# Patient Record
Sex: Female | Born: 1935 | Race: White | Hispanic: No | Marital: Single | Smoking: Former smoker
Health system: Southern US, Community
[De-identification: ages and names within clinical notes are randomized; demographics above are authoritative.]

## PROBLEM LIST (undated history)

## (undated) DIAGNOSIS — I10 Essential (primary) hypertension: Secondary | ICD-10-CM

## (undated) DIAGNOSIS — M1990 Unspecified osteoarthritis, unspecified site: Secondary | ICD-10-CM

## (undated) DIAGNOSIS — E785 Hyperlipidemia, unspecified: Secondary | ICD-10-CM

## (undated) DIAGNOSIS — I739 Peripheral vascular disease, unspecified: Secondary | ICD-10-CM

## (undated) DIAGNOSIS — M8580 Other specified disorders of bone density and structure, unspecified site: Secondary | ICD-10-CM

## (undated) DIAGNOSIS — K219 Gastro-esophageal reflux disease without esophagitis: Secondary | ICD-10-CM

## (undated) HISTORY — PX: ABDOMINAL HYSTERECTOMY: SHX81

## (undated) HISTORY — DX: Other specified disorders of bone density and structure, unspecified site: M85.80

## (undated) HISTORY — DX: Peripheral vascular disease, unspecified: I73.9

## (undated) HISTORY — DX: Gastro-esophageal reflux disease without esophagitis: K21.9

## (undated) HISTORY — DX: Hyperlipidemia, unspecified: E78.5

## (undated) HISTORY — DX: Unspecified osteoarthritis, unspecified site: M19.90

## (undated) HISTORY — DX: Essential (primary) hypertension: I10

---

## 1998-04-20 ENCOUNTER — Other Ambulatory Visit: Admission: RE | Admit: 1998-04-20 | Discharge: 1998-04-20 | Payer: Self-pay | Admitting: Gastroenterology

## 1998-07-19 ENCOUNTER — Emergency Department (HOSPITAL_COMMUNITY): Admission: EM | Admit: 1998-07-19 | Discharge: 1998-07-19 | Payer: Self-pay | Admitting: Emergency Medicine

## 2004-04-12 ENCOUNTER — Ambulatory Visit: Payer: Self-pay | Admitting: Internal Medicine

## 2004-06-13 ENCOUNTER — Ambulatory Visit: Payer: Self-pay | Admitting: Internal Medicine

## 2004-09-27 ENCOUNTER — Ambulatory Visit: Payer: Self-pay | Admitting: Internal Medicine

## 2004-10-28 ENCOUNTER — Ambulatory Visit: Payer: Self-pay | Admitting: Internal Medicine

## 2004-11-11 ENCOUNTER — Ambulatory Visit: Payer: Self-pay | Admitting: Cardiology

## 2006-02-12 ENCOUNTER — Ambulatory Visit: Payer: Self-pay | Admitting: Internal Medicine

## 2006-02-17 ENCOUNTER — Ambulatory Visit: Payer: Self-pay | Admitting: Internal Medicine

## 2006-03-04 ENCOUNTER — Ambulatory Visit: Payer: Self-pay

## 2006-08-06 ENCOUNTER — Ambulatory Visit: Payer: Self-pay | Admitting: Internal Medicine

## 2007-02-23 IMAGING — CT CT PELVIS W/ CM
1 of 4 series · 14 of 32 positions shown, 19 images · IV contrast (omnipaque)
Comparison: none

CLINICAL DATA: Epigastric pain going back several months.  Hypertension. 
 ABDOMEN CT WITH CONTRAST:
TECHNIQUE: Multidetector CT imaging of the abdomen was performed following the standard protocol during bolus administration of intravenous contrast.
 Contrast:  80 cc Omnipaque 300
 The lung bases are clear.  There is an enhancing focus in the dome of the right lobe of liver most consistent with a small hemangioma.  The remainder of the liver is rather low in attenuation consistent with diffuse fatty infiltrate.  No ductal dilatation is seen.  The gallbladder is contracted and no calcified gallstones are seen.  The pancreas is normal in size with normal peripancreatic fat planes.  The adrenal glands and spleen appear normal.  The kidneys enhance normally and on delayed images the pelvocaliceal systems appear normal.  The abdominal aorta is normal in caliber.
TECHNIQUE: Multidetector CT imaging of the pelvis was performed following the standard protocol during bolus administration of intravenous contrast.
 Scans were continued through the pelvis after oral and IV contrast media were given.  The colon is relatively decompressed.  The appendix is well seen and appears normal.  The urinary bladder is unremarkable and the uterus appears to have been removed.  No pelvic mass or fluid is seen.  No bony abnormality is noted.

[Series 2: abd_pel 5.0 b30f st · axial · 0.65mm/px · z∈[-472,-122]mm · 14 of 80 slices shown, 19 images]
[im 5/80  soft-tissue]
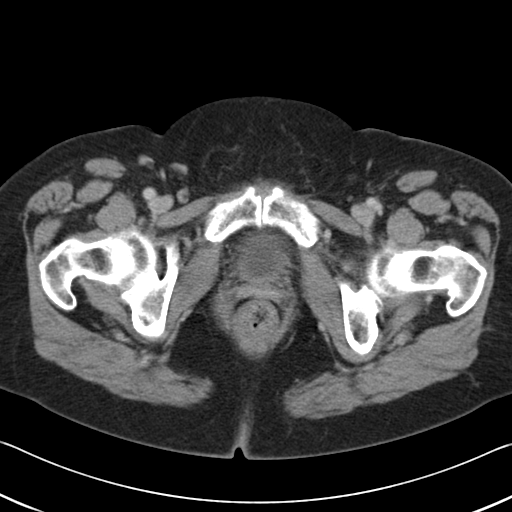
[im 5/80  bone]
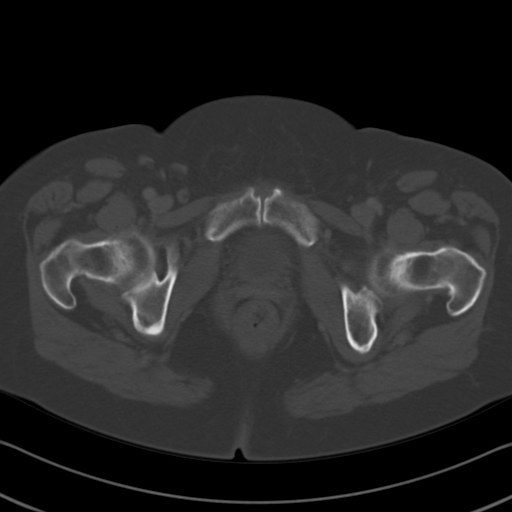
[im 10/80  soft-tissue]
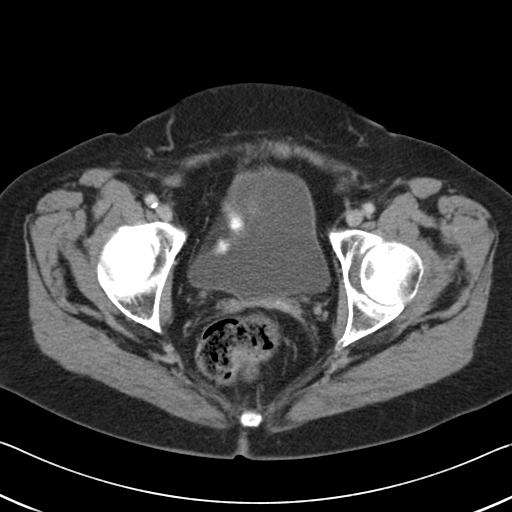
[im 19/80  soft-tissue]
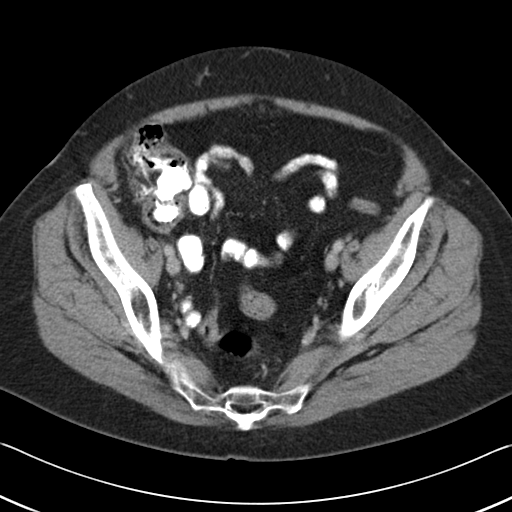
[im 24/80  soft-tissue]
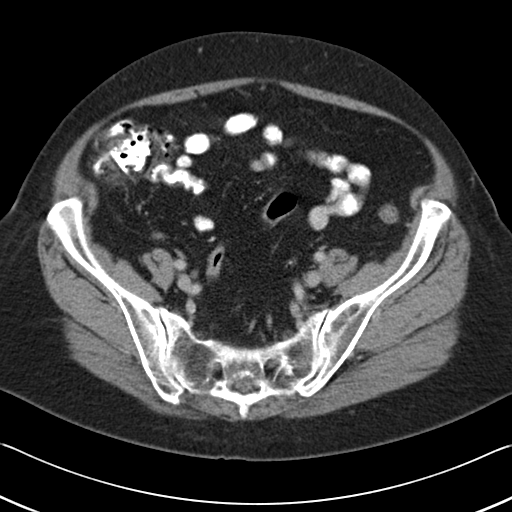
[im 28/80  soft-tissue]
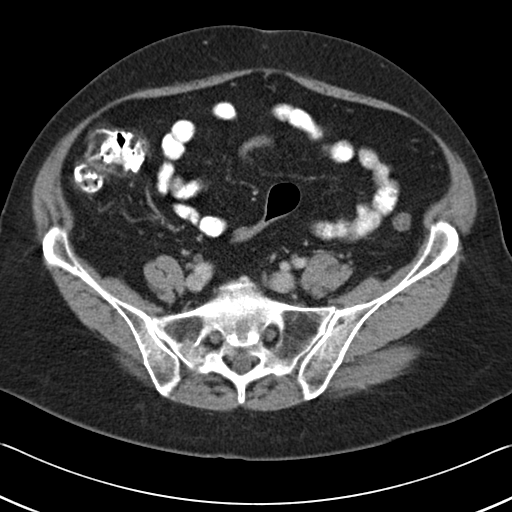
[im 33/80  soft-tissue]
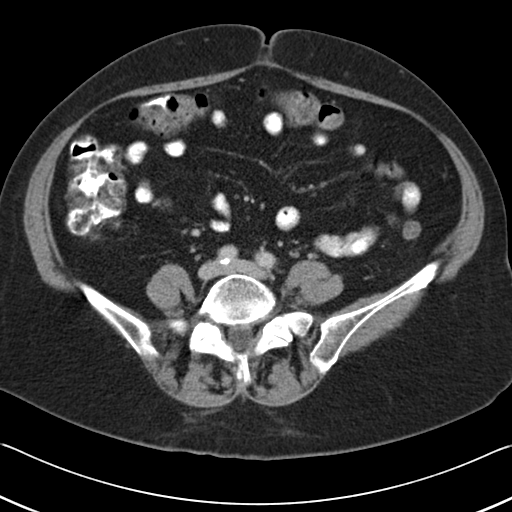
[im 42/80  soft-tissue]
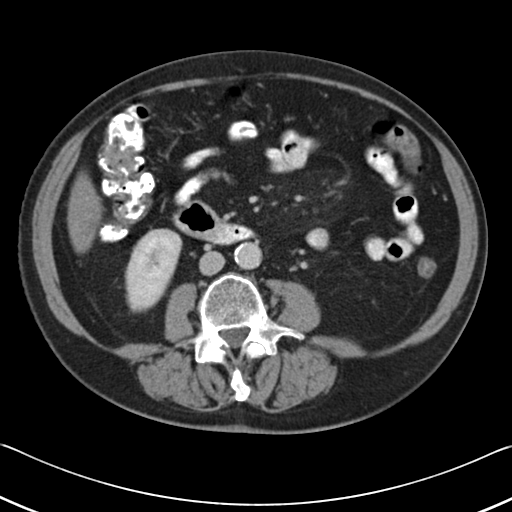
[im 47/80  soft-tissue]
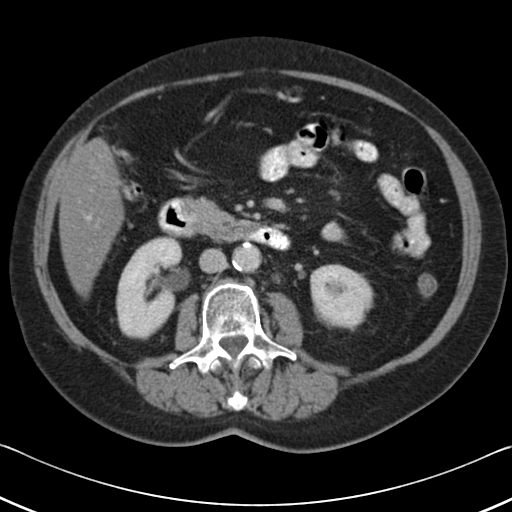
[im 52/80  soft-tissue]
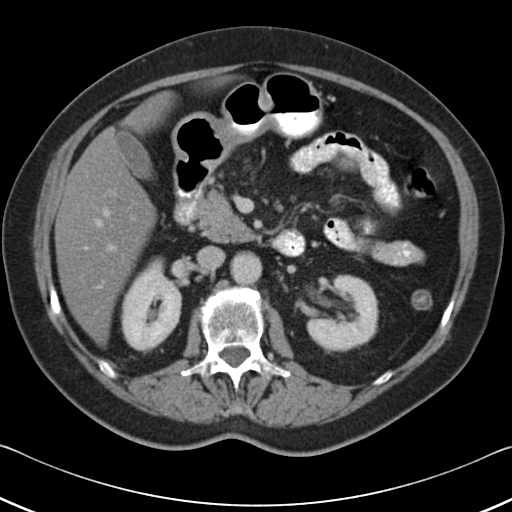
[im 52/80  bone]
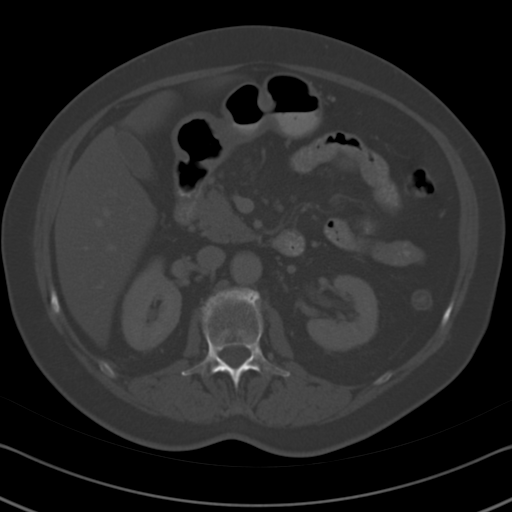
[im 56/80  soft-tissue]
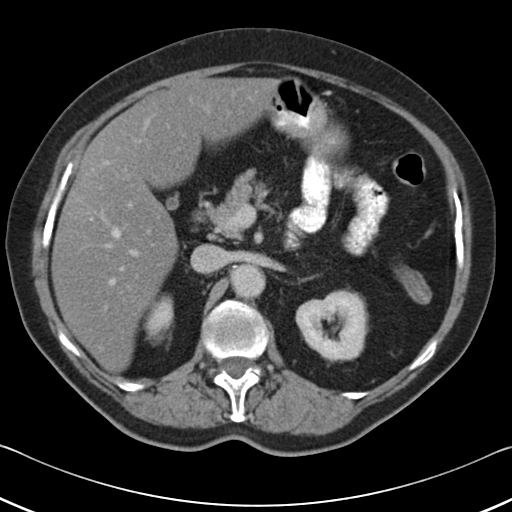
[im 61/80  soft-tissue]
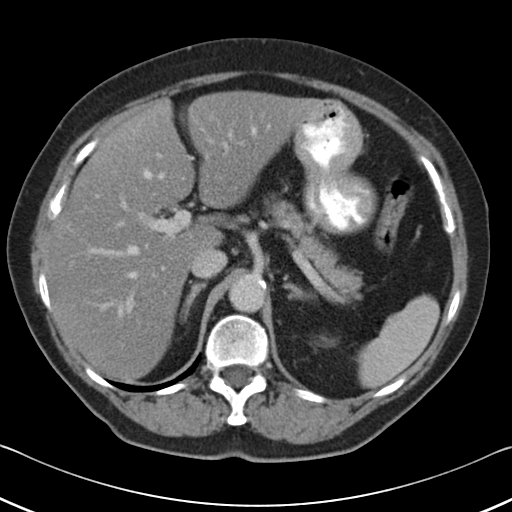
[im 61/80  lung]
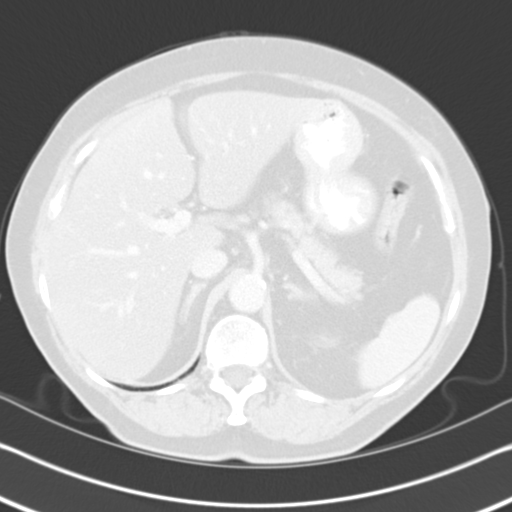
[im 66/80  lung]
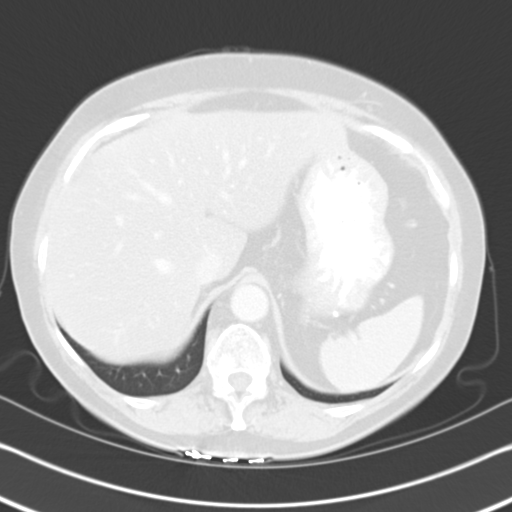
[im 70/80  soft-tissue]
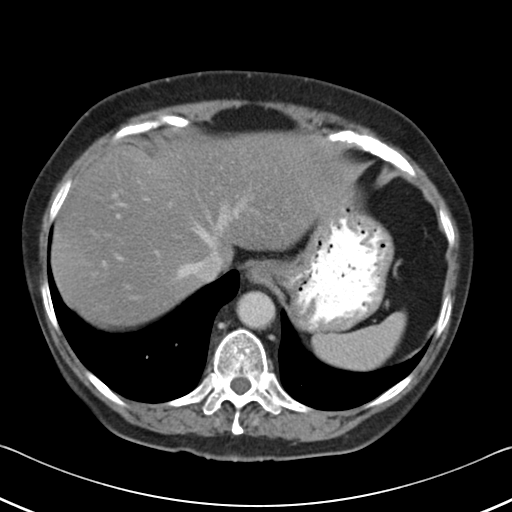
[im 70/80  lung]
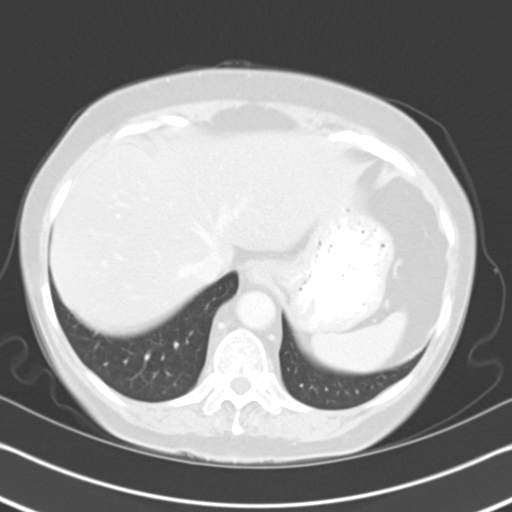
[im 75/80  soft-tissue]
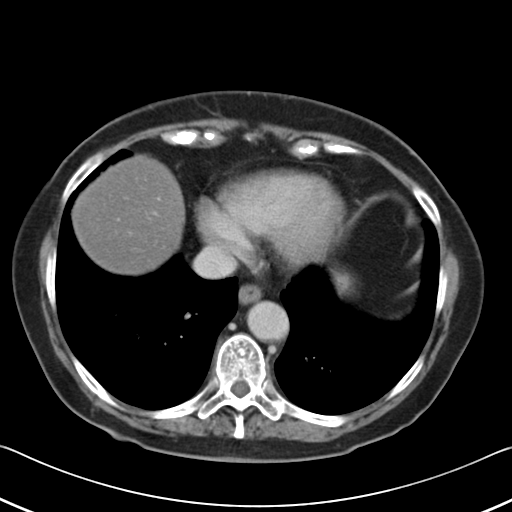
[im 75/80  lung]
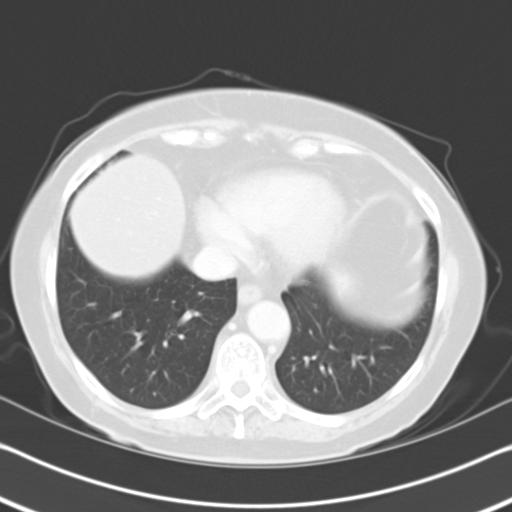

[14 of 32 positions shown; findings below may reference images not displayed]

IMPRESSION: 1.  Probable diffuse fatty infiltration of the liver with small area of enhancement in the dome of the right lobe most consistent with small hemangioma.  No ductal dilatation. 
 PELVIS CT WITH CONTRAST:
IMPRESSION: Negative CT of the pelvis for acute abnormality.  The appendix appears normal.

## 2007-03-09 ENCOUNTER — Telehealth: Payer: Self-pay | Admitting: Internal Medicine

## 2007-03-15 ENCOUNTER — Ambulatory Visit: Payer: Self-pay | Admitting: Internal Medicine

## 2007-03-15 LAB — CONVERTED CEMR LAB
Alkaline Phosphatase: 44 units/L (ref 39–117)
BUN: 16 mg/dL (ref 6–23)
Bilirubin Urine: NEGATIVE
Bilirubin, Direct: 0.1 mg/dL (ref 0.0–0.3)
Direct LDL: 186.2 mg/dL
Eosinophils Relative: 1.7 % (ref 0.0–5.0)
GFR calc Af Amer: 78 mL/min
HCT: 42.3 % (ref 36.0–46.0)
HDL: 58.7 mg/dL (ref >39.0)
Hemoglobin: 14.8 g/dL (ref 12.0–15.0)
Ketones, ur: NEGATIVE mg/dL
Lymphocytes Relative: 31.3 % (ref 12.0–46.0)
MCHC: 35 g/dL (ref 30.0–36.0)
MCV: 96.8 fL (ref 78.0–100.0)
Monocytes Relative: 8.9 % (ref 3.0–11.0)
Neutrophils Relative %: 57.5 % (ref 43.0–77.0)
Nitrite: NEGATIVE
Platelets: 190 10*3/uL (ref 150–400)
RDW: 11.8 % (ref 11.5–14.6)
Sodium: 143 meq/L (ref 135–145)
Specific Gravity, Urine: 1.02 (ref 1.000–1.03)
Total Bilirubin: 0.7 mg/dL (ref 0.3–1.2)
Total CHOL/HDL Ratio: 4.6
Urine Glucose: NEGATIVE mg/dL
VLDL: 31 mg/dL (ref 0–40)
WBC: 6.4 10*3/uL (ref 4.5–10.5)

## 2007-03-22 ENCOUNTER — Ambulatory Visit: Payer: Self-pay | Admitting: Internal Medicine

## 2007-03-22 DIAGNOSIS — K219 Gastro-esophageal reflux disease without esophagitis: Secondary | ICD-10-CM

## 2007-03-22 DIAGNOSIS — M949 Disorder of cartilage, unspecified: Secondary | ICD-10-CM

## 2007-03-22 DIAGNOSIS — I10 Essential (primary) hypertension: Secondary | ICD-10-CM | POA: Insufficient documentation

## 2007-03-22 DIAGNOSIS — M899 Disorder of bone, unspecified: Secondary | ICD-10-CM | POA: Insufficient documentation

## 2007-03-22 DIAGNOSIS — E785 Hyperlipidemia, unspecified: Secondary | ICD-10-CM

## 2007-03-22 DIAGNOSIS — R609 Edema, unspecified: Secondary | ICD-10-CM

## 2007-03-26 ENCOUNTER — Encounter: Payer: Self-pay | Admitting: Internal Medicine

## 2007-03-26 DIAGNOSIS — G4700 Insomnia, unspecified: Secondary | ICD-10-CM | POA: Insufficient documentation

## 2007-04-22 ENCOUNTER — Telehealth: Payer: Self-pay | Admitting: Internal Medicine

## 2007-05-21 ENCOUNTER — Ambulatory Visit: Payer: Self-pay | Admitting: Internal Medicine

## 2007-05-21 DIAGNOSIS — N3090 Cystitis, unspecified without hematuria: Secondary | ICD-10-CM | POA: Insufficient documentation

## 2007-05-21 DIAGNOSIS — R5381 Other malaise: Secondary | ICD-10-CM

## 2007-05-21 DIAGNOSIS — R5383 Other fatigue: Secondary | ICD-10-CM

## 2007-05-21 DIAGNOSIS — R209 Unspecified disturbances of skin sensation: Secondary | ICD-10-CM

## 2007-05-21 DIAGNOSIS — L723 Sebaceous cyst: Secondary | ICD-10-CM

## 2007-05-21 LAB — CONVERTED CEMR LAB
Albumin: 4.2 g/dL (ref 3.5–5.2)
Basophils Absolute: 0.2 10*3/uL — ABNORMAL HIGH (ref 0.0–0.1)
Basophils Relative: 2.1 % — ABNORMAL HIGH (ref 0.0–1.0)
Bilirubin Urine: NEGATIVE
Bilirubin, Direct: 0.1 mg/dL (ref 0.0–0.3)
CO2: 28 meq/L (ref 19–32)
Eosinophils Relative: 2.1 % (ref 0.0–5.0)
GFR calc Af Amer: 89 mL/min
GFR calc non Af Amer: 74 mL/min
Glucose, Bld: 103 mg/dL — ABNORMAL HIGH (ref 70–99)
Hemoglobin, Urine: NEGATIVE
Hemoglobin: 13.5 g/dL (ref 12.0–15.0)
MCHC: 35.5 g/dL (ref 30.0–36.0)
Monocytes Absolute: 0.6 10*3/uL (ref 0.2–0.7)
Mucus, UA: NEGATIVE
Neutro Abs: 5.1 10*3/uL (ref 1.4–7.7)
Neutrophils Relative %: 58 % (ref 43.0–77.0)
Nitrite: NEGATIVE
Potassium: 4.2 meq/L (ref 3.5–5.1)
RBC: 3.94 M/uL (ref 3.87–5.11)
Sodium: 137 meq/L (ref 135–145)
Specific Gravity, Urine: 1.015 (ref 1.000–1.03)
TSH: 0.77 microintl units/mL (ref 0.35–5.50)
Total Bilirubin: 0.7 mg/dL (ref 0.3–1.2)
Total Protein: 6.8 g/dL (ref 6.0–8.3)
Urine Glucose: NEGATIVE mg/dL
Urobilinogen, UA: 0.2 (ref 0.0–1.0)
Vitamin B-12: 559 pg/mL (ref 211–911)

## 2007-05-25 ENCOUNTER — Telehealth: Payer: Self-pay | Admitting: Internal Medicine

## 2007-06-25 ENCOUNTER — Ambulatory Visit: Payer: Self-pay | Admitting: Internal Medicine

## 2007-06-25 DIAGNOSIS — I739 Peripheral vascular disease, unspecified: Secondary | ICD-10-CM

## 2007-09-13 ENCOUNTER — Encounter: Payer: Self-pay | Admitting: Internal Medicine

## 2007-09-13 ENCOUNTER — Ambulatory Visit: Payer: Self-pay | Admitting: Internal Medicine

## 2007-09-16 ENCOUNTER — Encounter: Payer: Self-pay | Admitting: Internal Medicine

## 2007-09-29 ENCOUNTER — Encounter: Payer: Self-pay | Admitting: Internal Medicine

## 2007-09-30 ENCOUNTER — Ambulatory Visit: Payer: Self-pay | Admitting: Internal Medicine

## 2007-12-15 ENCOUNTER — Ambulatory Visit: Payer: Self-pay | Admitting: Internal Medicine

## 2007-12-16 LAB — CONVERTED CEMR LAB
Alkaline Phosphatase: 43 units/L (ref 39–117)
BUN: 18 mg/dL (ref 6–23)
Bilirubin Urine: NEGATIVE
Bilirubin, Direct: 0.1 mg/dL (ref 0.0–0.3)
Cholesterol: 240 mg/dL (ref 0–200)
GFR calc non Af Amer: 74 mL/min
Glucose, Bld: 111 mg/dL — ABNORMAL HIGH (ref 70–99)
Ketones, ur: NEGATIVE mg/dL
Nitrite: NEGATIVE
Potassium: 5.1 meq/L (ref 3.5–5.1)
Sodium: 143 meq/L (ref 135–145)
Specific Gravity, Urine: 1.025 (ref 1.000–1.03)
TSH: 1.48 microintl units/mL (ref 0.35–5.50)
Triglycerides: 185 mg/dL — ABNORMAL HIGH (ref 0–149)
Urobilinogen, UA: 0.2 (ref 0.0–1.0)
pH: 5.5 (ref 5.0–8.0)

## 2007-12-22 ENCOUNTER — Ambulatory Visit: Payer: Self-pay | Admitting: Internal Medicine

## 2007-12-22 DIAGNOSIS — R350 Frequency of micturition: Secondary | ICD-10-CM

## 2007-12-22 DIAGNOSIS — H60399 Other infective otitis externa, unspecified ear: Secondary | ICD-10-CM | POA: Insufficient documentation

## 2007-12-22 DIAGNOSIS — R109 Unspecified abdominal pain: Secondary | ICD-10-CM | POA: Insufficient documentation

## 2008-04-21 ENCOUNTER — Ambulatory Visit: Payer: Self-pay | Admitting: Internal Medicine

## 2008-04-23 LAB — CONVERTED CEMR LAB
Calcium: 9.6 mg/dL (ref 8.4–10.5)
Chloride: 104 meq/L (ref 96–112)
Creatinine, Ser: 0.8 mg/dL (ref 0.4–1.2)
GFR calc Af Amer: 89 mL/min
GFR calc non Af Amer: 74 mL/min

## 2008-04-27 ENCOUNTER — Ambulatory Visit: Payer: Self-pay | Admitting: Internal Medicine

## 2008-04-27 DIAGNOSIS — R7309 Other abnormal glucose: Secondary | ICD-10-CM | POA: Insufficient documentation

## 2008-10-25 ENCOUNTER — Ambulatory Visit: Payer: Self-pay | Admitting: Internal Medicine

## 2008-10-25 LAB — CONVERTED CEMR LAB
ALT: 15 units/L (ref 0–35)
AST: 18 units/L (ref 0–37)
Alkaline Phosphatase: 46 units/L (ref 39–117)
BUN: 12 mg/dL (ref 6–23)
Bilirubin, Direct: 0.1 mg/dL (ref 0.0–0.3)
CO2: 30 meq/L (ref 19–32)
Calcium: 9.3 mg/dL (ref 8.4–10.5)
Chloride: 103 meq/L (ref 96–112)
Direct LDL: 177.6 mg/dL
Hemoglobin, Urine: NEGATIVE
Hgb A1c MFr Bld: 5.8 % (ref 4.6–6.5)
Ketones, ur: NEGATIVE mg/dL
Nitrite: NEGATIVE
Potassium: 4.9 meq/L (ref 3.5–5.1)
Sodium: 143 meq/L (ref 135–145)
Specific Gravity, Urine: 1.02 (ref 1.000–1.030)
TSH: 1.9 microintl units/mL (ref 0.35–5.50)
Total Bilirubin: 0.8 mg/dL (ref 0.3–1.2)
Triglycerides: 147 mg/dL (ref 0.0–149.0)
Urobilinogen, UA: 0.2 (ref 0.0–1.0)

## 2008-10-30 ENCOUNTER — Ambulatory Visit: Payer: Self-pay | Admitting: Internal Medicine

## 2009-01-19 ENCOUNTER — Encounter: Payer: Self-pay | Admitting: Internal Medicine

## 2009-04-23 ENCOUNTER — Ambulatory Visit: Payer: Self-pay | Admitting: Internal Medicine

## 2009-04-23 DIAGNOSIS — M1990 Unspecified osteoarthritis, unspecified site: Secondary | ICD-10-CM | POA: Insufficient documentation

## 2009-04-23 DIAGNOSIS — M545 Low back pain: Secondary | ICD-10-CM | POA: Insufficient documentation

## 2009-04-24 LAB — CONVERTED CEMR LAB
Nitrite: NEGATIVE
Urobilinogen, UA: 0.2 (ref 0.0–1.0)

## 2009-08-01 ENCOUNTER — Ambulatory Visit: Payer: Self-pay | Admitting: Internal Medicine

## 2010-01-25 ENCOUNTER — Ambulatory Visit: Payer: Self-pay | Admitting: Internal Medicine

## 2010-01-25 LAB — CONVERTED CEMR LAB
AST: 18 units/L (ref 0–37)
BUN: 17 mg/dL (ref 6–23)
Basophils Absolute: 0 10*3/uL (ref 0.0–0.1)
Basophils Relative: 0.8 % (ref 0.0–3.0)
Bilirubin Urine: NEGATIVE
Bilirubin, Direct: 0.2 mg/dL (ref 0.0–0.3)
CO2: 29 meq/L (ref 19–32)
Calcium: 9.2 mg/dL (ref 8.4–10.5)
Chloride: 104 meq/L (ref 96–112)
Direct LDL: 152.9 mg/dL
Eosinophils Absolute: 0.1 10*3/uL (ref 0.0–0.7)
GFR calc non Af Amer: 68.39 mL/min (ref >60)
Glucose, Bld: 115 mg/dL — ABNORMAL HIGH (ref 70–99)
HDL: 60.9 mg/dL (ref >39.00)
Hemoglobin: 14.4 g/dL (ref 12.0–15.0)
Ketones, ur: NEGATIVE mg/dL
Lymphocytes Relative: 28.8 % (ref 12.0–46.0)
MCHC: 34.9 g/dL (ref 30.0–36.0)
Monocytes Relative: 7.7 % (ref 3.0–12.0)
Neutrophils Relative %: 60.8 % (ref 43.0–77.0)
RBC: 4.21 M/uL (ref 3.87–5.11)
RDW: 13 % (ref 11.5–14.6)
Sodium: 140 meq/L (ref 135–145)
Total Bilirubin: 0.7 mg/dL (ref 0.3–1.2)
Total Protein: 6.5 g/dL (ref 6.0–8.3)
Triglycerides: 132 mg/dL (ref 0.0–149.0)
WBC: 6 10*3/uL (ref 4.5–10.5)

## 2010-01-31 ENCOUNTER — Ambulatory Visit: Payer: Self-pay | Admitting: Internal Medicine

## 2010-02-15 ENCOUNTER — Telehealth: Payer: Self-pay | Admitting: Internal Medicine

## 2010-06-11 NOTE — Miscellaneous (Signed)
Summary: [facility]   Imported By: Lester Emporia 08/09/2009 11:46:30  _____________________________________________________________________  External Attachment:    Type:   Image     Comment:   External Document

## 2010-06-11 NOTE — Assessment & Plan Note (Signed)
Summary: CPX / [employer] /[employer]  #   Vital Signs:  Patient profile:   77 year old female Height:      61 inches Weight:      143 pounds BMI:     27.12 Temp:     97.1 degrees F oral Pulse rate:   80 / minute Pulse rhythm:   regular Resp:     16 per minute BP sitting:   126 / 70  (left arm) Cuff size:   regular  Vitals Entered By: Lanier Prude, Beverly Gust) (January 31, 2010 8:34 AM) CC: CPX Is Patient Diabetic? No   CC:  CPX.  History of Present Illness: The patient presents for a preventive health examination  Patient past medical history, social history, and family history reviewed in detail no significant changes.  Patient is physically active. Depression is negative and mood is good. Hearing is normal, and able to perform activities of daily living. Risk of falling is negligible and home safety has been reviewed and is appropriate. Patient has normal height, weight, and visual acuity. Patient has been counseled on age-appropriate routine health concerns for screening and prevention. Education, counseling done.   Preventive Screening-Counseling & Management  Alcohol-Tobacco     Alcohol drinks/day: 3     Alcohol type: wine     Alcohol Counseling: to decrease amount and/or frequency of alcohol intake     Smoking Status: quit > 6 months  Caffeine-Diet-Exercise     Caffeine Counseling: not indicated; caffeine use is not excessive or problematic     Diet Counseling: not indicated; diet is assessed to be healthy     Does Patient Exercise: no     Depression Counseling: not indicated; screening negative for depression  Hep-HIV-STD-Contraception     Hepatitis Risk: no risk noted     Sun Exposure-Excessive: no  Safety-Violence-Falls     Seat Belt Use: yes      Sexual History:  currently monogamous.    Current Medications (verified): 1)  Enalapril Maleate 10 Mg  Tabs (Enalapril Maleate) .Marland Kitchen.. 1 Bid 2)  Furosemide 20 Mg Tabs (Furosemide) .Marland Kitchen.. 1 Qam As Needed Swelling 3)  Potassium  Chloride Cr 10 Meq  Tbcr (Potassium Chloride) .Marland Kitchen.. 1 Once Daily As Needed With Furosemide 4)  Polyethylene Glycol 3350  Powd (Polyethylene Glycol 3350) .... Mix 1 Capful in Water or Juice and Drink Once Daily As Directed. 5)  Aspir-Low 81 Mg Tbec (Aspirin) .Marland Kitchen.. 1 Once Daily After Meal 6)  Ranitidine Hcl 150 Mg Caps (Ranitidine Hcl) .Marland Kitchen.. 1 Po Bid 7)  Vitamin D3 1000 Unit  Tabs (Cholecalciferol) .Marland Kitchen.. 1 Qd 8)  Premarin 0.625 Mg/gm Crea (Estrogens, Conjugated) .Marland Kitchen.. 1 G Pv As Dirrected Q3d X 12 D, Then 1g Pv Q 7-14 D For Dryness  Allergies (verified): 1)  Lipitor 2)  Vytorin 3)  Fosamax 4)  Tegretol 5)  Fish Oil (Fish Oil)  Past History:  Past Medical History: Last updated: 04/23/2009 GERD Hyperlipidemia  ( Statin intolerant) Osteopenia Hemorroids Hypertension Constipation Peripheral vascular disease carotids Low back pain Osteoarthritis  Past Surgical History: Last updated: 03/22/2007 Hysterectomy complete  Family History: Last updated: 03/22/2007 Family History of CAD Female 1st degree relative <60  Social History: Occupation: office work full time Former Smoker Married Regular exercise-no Alcohol use-yes 2-3 glasses of wine Does Patient Exercise:  no Smoking Status:  quit > 6 months Hepatitis Risk:  no risk noted Sun Exposure-Excessive:  no Seat Belt Use:  yes Sexual History:  currently monogamous  Contraindications/Deferment  of Procedures/Staging:    Test/Procedure: Colonoscopy    Reason for deferment: patient declined   Review of Systems  The patient denies anorexia, fever, weight loss, weight gain, vision loss, decreased hearing, hoarseness, chest pain, syncope, dyspnea on exertion, peripheral edema, prolonged cough, headaches, hemoptysis, abdominal pain, melena, hematochezia, severe indigestion/heartburn, hematuria, incontinence, genital sores, muscle weakness, suspicious skin lesions, transient blindness, difficulty walking, depression, unusual weight  change, abnormal bleeding, enlarged lymph nodes, angioedema, and breast masses.    Physical Exam  General:  Well-developed,well-nourished,in no acute distress; alert,appropriate and cooperative throughout examination Head:  Normocephalic and atraumatic without obvious abnormalities. No apparent alopecia or balding. Eyes:  No corneal or conjunctival inflammation noted. EOMI. Perrla. Funduscopic exam benign, without hemorrhages, exudates or papilledema. Vision grossly normal. Ears:  External ear exam shows no significant lesions or deformities.  Otoscopic examination reveals clear canals, tympanic membranes are intact bilaterally without bulging, retraction, inflammation or discharge. Hearing is grossly normal bilaterally. Nose:  External nasal examination shows no deformity or inflammation. Nasal mucosa are pink and moist without lesions or exudates. Mouth:  Oral mucosa and oropharynx without lesions or exudates.  Teeth in good repair. Neck:  No deformities, masses, or tenderness noted. Chest Wall:  No deformities, masses, or tenderness noted. Lungs:  Normal respiratory effort, chest expands symmetrically. Lungs are clear to auscultation, no crackles or wheezes. Heart:  Normal rate and regular rhythm. S1 and S2 normal without gallop, murmur, click, rub or other extra sounds. Abdomen:  Bowel sounds positive,abdomen soft and non-tender without masses, organomegaly or hernias noted. Msk:  Lumbar-sacral spine is tender to palpation over paraspinal muscles and painfull with the ROM, very stiff Pulses:  R and L carotid,radial,femoral,dorsalis pedis and posterior tibial pulses are full and equal bilaterally Extremities:  No clubbing, cyanosis, edema, or deformity noted with normal full range of motion of all joints.   Neurologic:  No cranial nerve deficits noted. Station and gait are normal. Plantar reflexes are down-going bilaterally. DTRs are symmetrical throughout. Sensory, motor and coordinative  functions appear intact. Skin:  Intact without suspicious lesions or rashes Cervical Nodes:  No lymphadenopathy noted Psych:  Cognition and judgment appear intact. Alert and cooperative with normal attention span and concentration. No apparent delusions, illusions, hallucinations   Impression & Recommendations:  Problem # 1:  HEALTH MAINTENANCE EXAM (ICD-V70.0) Assessment New Declined Colonoscopy Overall doing well, age appropriate education and counseling updated and referral for appropriate preventive services done unless declined, immunizations up to date or declined, diet counseling done if overweight, urged to quit smoking if smokes, most recent labs reviewed and current ordered if appropriate, ecg reviewed or declined (interpretation per ECG scanned in the EMR if done); information regarding Medicare Preventation requirements given if appropriate.  The labs were reviewed with the patient.  GYN/mammo q 12 months   Problem # 2:  HYPERTENSION (ICD-401.9) Assessment: Unchanged  Her updated medication list for this problem includes:    Enalapril Maleate 10 Mg Tabs (Enalapril maleate) .Marland Kitchen... 1 bid    Furosemide 20 Mg Tabs (Furosemide) .Marland Kitchen... 1 qam as needed swelling  BP today: 126/70 Prior BP: 112/70 (08/01/2009)  Labs Reviewed: K+: 5.0 (01/25/2010) Creat: : 0.9 (01/25/2010)   Chol: 229 (01/25/2010)   HDL: 60.90 (01/25/2010)   LDL: DEL (12/15/2007)   TG: 132.0 (01/25/2010)  Problem # 3:  PERIPHERAL VASCULAR DISEASE (ICD-443.9) Assessment: Unchanged On the regimen of medicine(s) reflected in the chart    Problem # 4:  GERD (ICD-530.81) Assessment: Improved  Her updated medication  list for this problem includes:    Ranitidine Hcl 150 Mg Caps (Ranitidine hcl) .Marland Kitchen... 1 po bid  Problem # 5:  HYPERLIPIDEMIA (ICD-272.4) Assessment: Comment Only on diet - statin intol.  Complete Medication List: 1)  Enalapril Maleate 10 Mg Tabs (Enalapril maleate) .Marland Kitchen.. 1 bid 2)  Furosemide 20 Mg  Tabs (Furosemide) .Marland Kitchen.. 1 qam as needed swelling 3)  Potassium Chloride Cr 10 Meq Tbcr (Potassium chloride) .Marland Kitchen.. 1 once daily as needed with furosemide 4)  Polyethylene Glycol 3350 Powd (Polyethylene glycol 3350) .... Mix 1 capful in water or juice and drink once daily as directed. 5)  Aspir-low 81 Mg Tbec (Aspirin) .Marland Kitchen.. 1 once daily after meal 6)  Ranitidine Hcl 150 Mg Caps (Ranitidine hcl) .Marland Kitchen.. 1 po bid 7)  Vitamin D3 1000 Unit Tabs (Cholecalciferol) .Marland Kitchen.. 1 qd 8)  Premarin 0.625 Mg/gm Crea (Estrogens, conjugated) .Marland Kitchen.. 1 g pv as dirrected q3d x 12 d, then 1g pv q 7-14 d for dryness  Other Orders: Admin 1st Vaccine (01027) Flu Vaccine 42yrs + (25366)  Patient Instructions: 1)  Start taking a chair yoga class 2)  Please schedule a follow-up appointment in 6 months. Prescriptions: RANITIDINE HCL 150 MG CAPS (RANITIDINE HCL) 1 po bid  #180 Each x 3   Entered and Authorized by:   Tresa Garter MD   Signed by:   Tresa Garter MD on 01/31/2010   Method used:   Print then Give to Patient   RxID:   [id number] POLYETHYLENE GLYCOL 3350  POWD (POLYETHYLENE GLYCOL 3350) mix 1 capful in water or juice and drink once daily as directed.  #527 Gram x 3   Entered and Authorized by:   Tresa Garter MD   Signed by:   Tresa Garter MD on 01/31/2010   Method used:   Print then Give to Patient   RxID:   [id number] POTASSIUM CHLORIDE CR 10 MEQ  TBCR (POTASSIUM CHLORIDE) 1 once daily as needed with furosemide  #90 x 3   Entered and Authorized by:   Tresa Garter MD   Signed by:   Tresa Garter MD on 01/31/2010   Method used:   Print then Give to Patient   RxID:   [id number] FUROSEMIDE 20 MG TABS (FUROSEMIDE) 1 qam as needed swelling  #90 x 3   Entered and Authorized by:   Tresa Garter MD   Signed by:   Tresa Garter MD on 01/31/2010   Method used:   Print then Give to Patient   RxID:   [id number] ENALAPRIL MALEATE 10 MG  TABS  (ENALAPRIL MALEATE) 1 BID  #180 x 3   Entered and Authorized by:   Tresa Garter MD   Signed by:   Tresa Garter MD on 01/31/2010   Method used:   Print then Give to Patient   RxID:   [id number]   .lbflu   Flu Vaccine Consent Questions     Do you have a history of severe allergic reactions to this vaccine? no    Any prior history of allergic reactions to egg and/or gelatin? no    Do you have a sensitivity to the preservative Thimersol? no    Do you have a past history of Guillan-Barre Syndrome? no    Do you currently have an acute febrile illness? no    Have you ever had a severe reaction to latex? no    Vaccine information given and explained to patient? yes  Are you currently pregnant? no    Lot Number:[id number]   Exp Date:11/09/2010   Site Given  right Deltoid IM Lanier Prude, [facility])  January 31, 2010 9:50 AM

## 2010-06-11 NOTE — Assessment & Plan Note (Signed)
Summary: 3 MTH FU   STC   Vital Signs:  Patient profile:   77 year old female Weight:      146 pounds BMI:     27.69 Temp:     98 degrees F oral Pulse rate:   72 / minute BP sitting:   112 / 70  (left arm)  Vitals Entered By: Tora Perches (August 01, 2009 8:11 AM) CC: f/u Is Patient Diabetic? No   CC:  f/u.  History of Present Illness: The patient presents for a follow up of hypertension, GERD, OA LBP is better  Preventive Screening-Counseling & Management  Alcohol-Tobacco     Smoking Status: never  Current Medications (verified): 1)  Enalapril Maleate 10 Mg  Tabs (Enalapril Maleate) .Marland Kitchen.. 1 Bid 2)  Furosemide 20 Mg Tabs (Furosemide) .Marland Kitchen.. 1 Qam As Needed Swelling 3)  Potassium Chloride Cr 10 Meq  Tbcr (Potassium Chloride) .Marland Kitchen.. 1 Once Daily As Needed With Furosemide 4)  Polyethylene Glycol 3350  Powd (Polyethylene Glycol 3350) .... Mix 1 Capful in Water or Juice and Drink Once Daily As Directed. 5)  Aspir-Low 81 Mg Tbec (Aspirin) .Marland Kitchen.. 1 Once Daily After Meal 6)  Ranitidine Hcl 150 Mg Caps (Ranitidine Hcl) .Marland Kitchen.. 1 Po Bid 7)  Vitamin D3 1000 Unit  Tabs (Cholecalciferol) .Marland Kitchen.. 1 Qd 8)  Premarin 0.625 Mg/gm Crea (Estrogens, Conjugated) .Marland Kitchen.. 1 G Pv As Dirrected Q3d X 12 D, Then 1g Pv Q 7-14 D For Dryness  Allergies: 1)  Lipitor 2)  Vytorin 3)  Fosamax 4)  Tegretol 5)  Fish Oil (Fish Oil)  Past History:  Past Medical History: Last updated: 04/23/2009 GERD Hyperlipidemia  ( Statin intolerant) Osteopenia Hemorroids Hypertension Constipation Peripheral vascular disease carotids Low back pain Osteoarthritis  Past Surgical History: Last updated: 03/22/2007 Hysterectomy complete  Social History: Last updated: 05/21/2007 Occupation: office  Former Smoker Married  Physical Exam  General:  Well-developed,well-nourished,in no acute distress; alert,appropriate and cooperative throughout examination Mouth:  Oral mucosa and oropharynx without lesions or exudates.   Teeth in good repair. Lungs:  Normal respiratory effort, chest expands symmetrically. Lungs are clear to auscultation, no crackles or wheezes. Heart:  Normal rate and regular rhythm. S1 and S2 normal without gallop, murmur, click, rub or other extra sounds. Abdomen:  Bowel sounds positive,abdomen soft and non-tender without masses, organomegaly or hernias noted. Msk:  Lumbar-sacral spine is tender to palpation over paraspinal muscles and painfull with the ROM, very stiff Neurologic:  No cranial nerve deficits noted. Station and gait are normal. Plantar reflexes are down-going bilaterally. DTRs are symmetrical throughout. Sensory, motor and coordinative functions appear intact. Better balance Skin:  Intact without suspicious lesions or rashes Psych:  Cognition and judgment appear intact. Alert and cooperative with normal attention span and concentration. No apparent delusions, illusions, hallucinations   Impression & Recommendations:  Problem # 1:  LOW BACK PAIN (ICD-724.2) Assessment Improved  Her updated medication list for this problem includes:    Aspir-low 81 Mg Tbec (Aspirin) .Marland Kitchen... 1 once daily after meal Try Advil pm Refused Rx  Problem # 2:  HYPERTENSION (ICD-401.9) Assessment: Improved  Her updated medication list for this problem includes:    Enalapril Maleate 10 Mg Tabs (Enalapril maleate) .Marland Kitchen... 1 bid    Furosemide 20 Mg Tabs (Furosemide) .Marland Kitchen... 1 qam as needed swelling  BP today: 112/70 Prior BP: 148/84 (04/23/2009)  Labs Reviewed: K+: 4.9 (10/25/2008) Creat: : 0.8 (10/25/2008)   Chol: 256 (10/25/2008)   HDL: 55.50 (10/25/2008)   LDL:  DEL (12/15/2007)   TG: 147.0 (10/25/2008)  Problem # 3:  HYPERLIPIDEMIA (ICD-272.4) Assessment: Unchanged  Diet  Labs Reviewed: SGOT: 18 (10/25/2008)   SGPT: 15 (10/25/2008)   HDL:55.50 (10/25/2008), 46.8 (12/15/2007)  LDL:DEL (12/15/2007), DEL (03/15/2007)  Chol:256 (10/25/2008), 240 (12/15/2007)  Trig:147.0 (10/25/2008), 185  (12/15/2007)  Problem # 4:  VAGINITIS, ATROPHIC (ICD-627.3) Assessment: Improved  Her updated medication list for this problem includes:    Premarin 0.625 Mg/gm Crea (Estrogens, conjugated) .Marland Kitchen... 1 g pv as dirrected q3d x 12 d, then 1g pv q 7-14 d for dryness  Problem # 5:  OSTEOARTHRITIS (ICD-715.90) Assessment: Deteriorated  Her updated medication list for this problem includes:    Aspir-low 81 Mg Tbec (Aspirin) .Marland Kitchen... 1 once daily after meal  Complete Medication List: 1)  Enalapril Maleate 10 Mg Tabs (Enalapril maleate) .Marland Kitchen.. 1 bid 2)  Furosemide 20 Mg Tabs (Furosemide) .Marland Kitchen.. 1 qam as needed swelling 3)  Potassium Chloride Cr 10 Meq Tbcr (Potassium chloride) .Marland Kitchen.. 1 once daily as needed with furosemide 4)  Polyethylene Glycol 3350 Powd (Polyethylene glycol 3350) .... Mix 1 capful in water or juice and drink once daily as directed. 5)  Aspir-low 81 Mg Tbec (Aspirin) .Marland Kitchen.. 1 once daily after meal 6)  Ranitidine Hcl 150 Mg Caps (Ranitidine hcl) .Marland Kitchen.. 1 po bid 7)  Vitamin D3 1000 Unit Tabs (Cholecalciferol) .Marland Kitchen.. 1 qd 8)  Premarin 0.625 Mg/gm Crea (Estrogens, conjugated) .Marland Kitchen.. 1 g pv as dirrected q3d x 12 d, then 1g pv q 7-14 d for dryness  Patient Instructions: 1)  Please schedule a follow-up appointment in 6 months well w/labs.

## 2010-06-11 NOTE — Progress Notes (Signed)
Summary: Colon and pneumo  Phone Note Outgoing Call   Call placed by: Lanier Prude, [facility]),  February 15, 2010 8:13 AM Summary of Call: left mess for pt to call back to inform pt last colon was 2003.Marland KitchenMarland KitchenDUE 2008!! and pneumovax was 02/2006 per paper chart.  Follow-up for Phone Call        pt informed by Broadus John. Follow-up by: Lanier Prude, [facility]),  February 15, 2010 2:50 PM

## 2010-06-20 ENCOUNTER — Encounter: Payer: Self-pay | Admitting: Internal Medicine

## 2010-07-17 ENCOUNTER — Encounter: Payer: Self-pay | Admitting: Internal Medicine

## 2010-07-23 NOTE — Miscellaneous (Signed)
Summary: Mammogram 2012  Clinical Lists Changes  Observations: Added new observation of MAMMOGRAM: normal (06/20/2010 9:12)      Preventive Care Screening  Mammogram:    Date:  06/20/2010    Results:  normal 

## 2010-08-13 ENCOUNTER — Ambulatory Visit (INDEPENDENT_AMBULATORY_CARE_PROVIDER_SITE_OTHER): Payer: BC Managed Care – PPO | Admitting: Internal Medicine

## 2010-08-13 ENCOUNTER — Encounter: Payer: Self-pay | Admitting: Internal Medicine

## 2010-08-13 DIAGNOSIS — R7309 Other abnormal glucose: Secondary | ICD-10-CM

## 2010-08-13 DIAGNOSIS — R609 Edema, unspecified: Secondary | ICD-10-CM

## 2010-08-13 DIAGNOSIS — M949 Disorder of cartilage, unspecified: Secondary | ICD-10-CM

## 2010-08-13 DIAGNOSIS — M1990 Unspecified osteoarthritis, unspecified site: Secondary | ICD-10-CM

## 2010-08-13 DIAGNOSIS — E785 Hyperlipidemia, unspecified: Secondary | ICD-10-CM

## 2010-08-13 DIAGNOSIS — I10 Essential (primary) hypertension: Secondary | ICD-10-CM

## 2010-08-13 DIAGNOSIS — M899 Disorder of bone, unspecified: Secondary | ICD-10-CM

## 2010-08-13 MED ORDER — FUROSEMIDE 20 MG PO TABS: 20.0000 mg | ORAL_TABLET | Freq: Every day | ORAL | Status: DC | PRN

## 2010-08-13 MED ORDER — ENALAPRIL MALEATE 10 MG PO TABS: 10.0000 mg | ORAL_TABLET | Freq: Two times a day (BID) | ORAL | Status: DC

## 2010-08-13 NOTE — Progress Notes (Signed)
  Subjective:    Patient ID: Toni King, female    DOB: [birth year], 77 y.o.   MRN: [id number]  HPI  The patient presents for a follow-up of  chronic hypertension, chronic dyslipidemia, type 2 prediabetes controlled with medicines    Review of Systems  Constitutional: Negative for appetite change.  HENT: Negative for neck pain and sinus pressure.   Eyes: Negative for pain.  Gastrointestinal: Negative for abdominal distention.  Genitourinary: Negative for difficulty urinating.  Musculoskeletal: Negative for arthralgias.  Neurological: Negative for dizziness.  Psychiatric/Behavioral: Negative for confusion. The patient is not nervous/anxious.        Objective:   Physical Exam  Constitutional: She appears well-developed.  HENT:  Mouth/Throat: No oropharyngeal exudate.  Eyes: Right eye exhibits no discharge.  Neck: No JVD present.  Cardiovascular: Normal rate.   No murmur heard. Pulmonary/Chest: She has no wheezes.  Abdominal: She exhibits no distension and no mass.  Musculoskeletal: She exhibits no edema.  Neurological: No cranial nerve deficit.  Skin: No erythema.          Assessment & Plan:

## 2010-08-13 NOTE — Assessment & Plan Note (Signed)
Unable to take statins 

## 2010-08-13 NOTE — Assessment & Plan Note (Signed)
Advil pc prn

## 2010-08-13 NOTE — Assessment & Plan Note (Signed)
Furosemide prn 

## 2010-08-13 NOTE — Assessment & Plan Note (Signed)
Cont with diet

## 2010-08-13 NOTE — Assessment & Plan Note (Signed)
Cont Rx 

## 2010-08-13 NOTE — Assessment & Plan Note (Signed)
Vit D and exercise

## 2010-08-13 NOTE — Patient Instructions (Signed)
Try Advil 2 a day as needed for stiffness (with food)

## 2010-08-30 ENCOUNTER — Telehealth: Payer: Self-pay | Admitting: *Deleted

## 2010-08-30 DIAGNOSIS — E785 Hyperlipidemia, unspecified: Secondary | ICD-10-CM

## 2010-08-30 DIAGNOSIS — I10 Essential (primary) hypertension: Secondary | ICD-10-CM

## 2010-08-30 DIAGNOSIS — T887XXA Unspecified adverse effect of drug or medicament, initial encounter: Secondary | ICD-10-CM

## 2010-08-30 DIAGNOSIS — R5383 Other fatigue: Secondary | ICD-10-CM

## 2010-08-30 NOTE — Telephone Encounter (Signed)
Patient requesting labs prior to next OV in October. (this will be her "physical" but she has medicare) Will need specific dx codes not V70.0

## 2010-08-31 NOTE — Telephone Encounter (Signed)
OK CBC, CMET, TSH, lipids, UA 272.20  401.1 995.20 Thx

## 2010-09-27 NOTE — Assessment & Plan Note (Signed)
[facility]                                 ON-CALL NOTE   NAME:Harcum, Tonjua                          MRN:          [id number]  DATE:04/22/2007                            DOB:          [birth year]    PHONE NUMBER:  [phone].   PRIMARY CARE PHYSICIAN:  Georgina Quint. Plotnikov, M.D.   SUBJECTIVE:  Ms. Kraft states that she has had one day of nausea, low  grade temperature of 100, and body aches.  She states that she called  her doctor and was told that she had to be seen before anything could be  done to help her.  She is calling because she is wanting to know if she  can have a medication to help her feel better.  She did take some  Phenergan earlier with mild relief of her nausea that was prescribed for  her by another doctor for a different reason.   ASSESSMENT/PLAN:  I instructed the patient that she should be evaluated  by a physician.  She states that she is too sick to go to the doctor.  She may take a Phenergan tablet in addition to the one she took  previously to see if this helps with her nausea.  I instructed her to  drink lots of fluids and get rest and to see her regular doctor in the  morning.     Kerby Nora, MD  Electronically Signed    AB/MedQ  DD: 04/22/2007  DT: 04/23/2007  Job #: [id number]

## 2010-12-19 ENCOUNTER — Other Ambulatory Visit: Payer: Self-pay | Admitting: *Deleted

## 2010-12-19 ENCOUNTER — Other Ambulatory Visit: Payer: Self-pay | Admitting: Internal Medicine

## 2011-02-03 ENCOUNTER — Telehealth: Payer: Self-pay | Admitting: *Deleted

## 2011-02-03 DIAGNOSIS — R609 Edema, unspecified: Secondary | ICD-10-CM

## 2011-02-03 DIAGNOSIS — R5383 Other fatigue: Secondary | ICD-10-CM

## 2011-02-03 DIAGNOSIS — E785 Hyperlipidemia, unspecified: Secondary | ICD-10-CM

## 2011-02-03 DIAGNOSIS — M545 Low back pain: Secondary | ICD-10-CM

## 2011-02-03 DIAGNOSIS — I10 Essential (primary) hypertension: Secondary | ICD-10-CM

## 2011-02-03 DIAGNOSIS — R7309 Other abnormal glucose: Secondary | ICD-10-CM

## 2011-02-03 NOTE — Telephone Encounter (Signed)
UA, lipids CMET, TSH Thx

## 2011-02-03 NOTE — Telephone Encounter (Signed)
Which labs would you like ordered for 10/3?

## 2011-02-03 NOTE — Telephone Encounter (Signed)
Lab orders entered

## 2011-02-03 NOTE — Telephone Encounter (Signed)
Message copied by Merrilyn Puma on Mon Feb 03, 2011 10:06 AM ------      Message from: Newell Coral      Created: Wed Jan 29, 2011  8:59 AM      Regarding: 6 month follow up wanting labs       This pt just called and asked if she could get her labs done prior to her appt on 10/3.  She didn't stated which labs she wanting done. Please advise on whether or not she could can do this.             Thanks so much!

## 2011-02-05 ENCOUNTER — Other Ambulatory Visit: Payer: Self-pay | Admitting: Internal Medicine

## 2011-02-05 ENCOUNTER — Other Ambulatory Visit (INDEPENDENT_AMBULATORY_CARE_PROVIDER_SITE_OTHER): Payer: Medicare Other

## 2011-02-05 DIAGNOSIS — I10 Essential (primary) hypertension: Secondary | ICD-10-CM

## 2011-02-05 DIAGNOSIS — R5381 Other malaise: Secondary | ICD-10-CM

## 2011-02-05 DIAGNOSIS — E785 Hyperlipidemia, unspecified: Secondary | ICD-10-CM

## 2011-02-05 DIAGNOSIS — M545 Low back pain: Secondary | ICD-10-CM

## 2011-02-05 LAB — URINALYSIS, ROUTINE W REFLEX MICROSCOPIC
Bilirubin Urine: NEGATIVE
Ketones, ur: NEGATIVE
Specific Gravity, Urine: 1.015 (ref 1.000–1.030)
Urine Glucose: NEGATIVE
pH: 6.5 (ref 5.0–8.0)

## 2011-02-05 LAB — COMPREHENSIVE METABOLIC PANEL
ALT: 14 U/L (ref 0–35)
AST: 17 U/L (ref 0–37)
Alkaline Phosphatase: 50 U/L (ref 39–117)
Glucose, Bld: 110 mg/dL — ABNORMAL HIGH (ref 70–99)
Sodium: 143 mEq/L (ref 135–145)
Total Bilirubin: 0.6 mg/dL (ref 0.3–1.2)
Total Protein: 6.8 g/dL (ref 6.0–8.3)

## 2011-02-05 LAB — LIPID PANEL
Cholesterol: 233 mg/dL — ABNORMAL HIGH (ref 0–200)
HDL: 60 mg/dL (ref >39.00)
Triglycerides: 134 mg/dL (ref 0.0–149.0)

## 2011-02-05 LAB — LDL CHOLESTEROL, DIRECT: Direct LDL: 160 mg/dL

## 2011-02-12 ENCOUNTER — Encounter: Payer: Self-pay | Admitting: Internal Medicine

## 2011-02-12 ENCOUNTER — Ambulatory Visit (INDEPENDENT_AMBULATORY_CARE_PROVIDER_SITE_OTHER): Payer: Medicare Other | Admitting: Internal Medicine

## 2011-02-12 DIAGNOSIS — I10 Essential (primary) hypertension: Secondary | ICD-10-CM

## 2011-02-12 DIAGNOSIS — M25551 Pain in right hip: Secondary | ICD-10-CM

## 2011-02-12 DIAGNOSIS — M25552 Pain in left hip: Secondary | ICD-10-CM | POA: Insufficient documentation

## 2011-02-12 DIAGNOSIS — M545 Low back pain: Secondary | ICD-10-CM

## 2011-02-12 DIAGNOSIS — Z0000 Encounter for general adult medical examination without abnormal findings: Secondary | ICD-10-CM

## 2011-02-12 DIAGNOSIS — R7309 Other abnormal glucose: Secondary | ICD-10-CM

## 2011-02-12 DIAGNOSIS — I739 Peripheral vascular disease, unspecified: Secondary | ICD-10-CM

## 2011-02-12 DIAGNOSIS — Z23 Encounter for immunization: Secondary | ICD-10-CM

## 2011-02-12 DIAGNOSIS — E785 Hyperlipidemia, unspecified: Secondary | ICD-10-CM

## 2011-02-12 NOTE — Assessment & Plan Note (Signed)
ASA Good HDL levels

## 2011-02-12 NOTE — Assessment & Plan Note (Signed)
Stretch  

## 2011-02-12 NOTE — Assessment & Plan Note (Signed)
Mild, chronic.  °

## 2011-02-12 NOTE — Progress Notes (Signed)
  Subjective:    Patient ID: Toni King, female    DOB: 26-Dec-1927, 77 y.o.   MRN: [id number]  HPI  The patient is here for a wellness exam. The patient has been doing well overall without major physical or psychological issues going on lately. Review of Systems  Constitutional: Negative for chills, activity change, appetite change, fatigue and unexpected weight change.  HENT: Negative for congestion, mouth sores and sinus pressure.   Eyes: Negative for visual disturbance.  Respiratory: Negative for cough and chest tightness.   Gastrointestinal: Negative for nausea and abdominal pain.  Genitourinary: Negative for frequency, difficulty urinating and vaginal pain.  Musculoskeletal: Positive for back pain and arthralgias (B hip hurt). Negative for gait problem.  Skin: Negative for pallor and rash.  Neurological: Negative for dizziness, tremors, weakness, numbness and headaches.  Psychiatric/Behavioral: Negative for confusion and sleep disturbance.       Objective:   Physical Exam  Constitutional: She appears well-developed and well-nourished. No distress.  HENT:  Head: Normocephalic.  Right Ear: External ear normal.  Left Ear: External ear normal.  Nose: Nose normal.  Mouth/Throat: Oropharynx is clear and moist.  Eyes: Conjunctivae are normal. Pupils are equal, round, and reactive to light. Right eye exhibits no discharge. Left eye exhibits no discharge.  Neck: Normal range of motion. Neck supple. No JVD present. No tracheal deviation present. No thyromegaly present.  Cardiovascular: Normal rate, regular rhythm and normal heart sounds.   Pulmonary/Chest: No stridor. No respiratory distress. She has no wheezes.  Abdominal: Soft. Bowel sounds are normal. She exhibits no distension and no mass. There is no tenderness. There is no rebound and no guarding.  Musculoskeletal: She exhibits tenderness (B troch major tender). She exhibits no edema.  Lymphadenopathy:    She has no cervical  adenopathy.  Neurological: She displays normal reflexes. No cranial nerve deficit. She exhibits normal muscle tone. Coordination normal.  Skin: No rash noted. No erythema.  Psychiatric: She has a normal mood and affect. Her behavior is normal. Judgment and thought content normal.  Stiff LS spine  Lab Results  Component Value Date   WBC 6.0 01/25/2010   HGB 14.4 01/25/2010   HCT 41.3 01/25/2010   PLT 179.0 01/25/2010   CHOL 233* 02/05/2011   TRIG 134.0 02/05/2011   HDL 60.00 02/05/2011   LDLDIRECT 160.0 02/05/2011   ALT 14 02/05/2011   AST 17 02/05/2011   NA 143 02/05/2011   K 4.5 02/05/2011   CL 107 02/05/2011   CREATININE 0.9 02/05/2011   BUN 16 02/05/2011   CO2 28 02/05/2011   TSH 2.08 02/05/2011   HGBA1C 5.8 10/25/2008         Assessment & Plan:

## 2011-02-12 NOTE — Assessment & Plan Note (Addendum)
The patient is here for annual Medicare wellness examination and management of other chronic and acute problems.   The risk factors are reflected in the social history.  The roster of all physicians providing medical care to patient - is listed in the Snapshot section of the chart.  Activities of daily living:  The patient is 100% inedpendent in all ADLs: dressing, toileting, feeding as well as independent mobility  Home safety : The patient has smoke detectors in the home. They wear seatbelts.No firearms at home ( firearms are present in the home, kept in a safe fashion). There is no violence in the home.   There is no risks for hepatitis, STDs or HIV. There is no   history of blood transfusion. They have no travel history to infectious disease endemic areas of the world.  The patient has (has not) seen their dentist in the last six month. They have (not) seen their eye doctor in the last year. They deny (admit to) any hearing difficulty and have not had audiologic testing in the last year.  They do not  have excessive sun exposure. Discussed the need for sun protection: hats, long sleeves and use of sunscreen if there is significant sun exposure.   Diet: the importance of a healthy diet is discussed. They do have a healthy (unhealthy-high fat/fast food) diet.  The patient has a regular exercise program: ______1_ , _h___duration, ___7__per week (yard work).  The benefits of regular aerobic exercise were discussed.  Depression screen: there are no signs or vegative symptoms of depression- irritability, change in appetite, anhedonia, sadness/tearfullness.  Cognitive assessment: the patient manages all their financial and personal affairs and is actively engaged. They could relate day,date,year and events; recalled 3/3 objects at 3 minutes; performed clock-face test normally.  The following portions of the patient's history were reviewed and updated as appropriate: allergies, current medications,  past family history, past medical history,  past surgical history, past social history  and problem list.  Vision, hearing, body mass index were assessed and reviewed.   During the course of the visit the patient was educated and counseled about appropriate screening and preventive services including : fall prevention , diabetes screening, nutrition counseling, colorectal cancer screening, and recommended immunizations. Colonosc up to date

## 2011-02-12 NOTE — Assessment & Plan Note (Signed)
US next year 

## 2011-02-12 NOTE — Patient Instructions (Signed)
Zostavax shingles vaccine.

## 2011-02-25 ENCOUNTER — Telehealth: Payer: Self-pay | Admitting: *Deleted

## 2011-02-25 MED ORDER — ZOSTER VACCINE LIVE 19400 UNT/0.65ML ~~LOC~~ SOLR: 0.6500 mL | Freq: Once | SUBCUTANEOUS | Status: DC

## 2011-02-25 NOTE — Telephone Encounter (Signed)
Pt left vm requesting Rx for Zostavax be sent to her pharmacy.  Done.

## 2011-03-20 ENCOUNTER — Other Ambulatory Visit: Payer: Self-pay | Admitting: Internal Medicine

## 2011-06-09 ENCOUNTER — Other Ambulatory Visit: Payer: Self-pay | Admitting: Internal Medicine

## 2011-08-04 ENCOUNTER — Encounter: Payer: Self-pay | Admitting: Internal Medicine

## 2011-08-04 ENCOUNTER — Ambulatory Visit (INDEPENDENT_AMBULATORY_CARE_PROVIDER_SITE_OTHER): Payer: BC Managed Care – PPO | Admitting: Internal Medicine

## 2011-08-04 VITALS — BP 128/70 | HR 76 | Resp 16 | Wt 135.0 lb

## 2011-08-04 DIAGNOSIS — I10 Essential (primary) hypertension: Secondary | ICD-10-CM

## 2011-08-04 DIAGNOSIS — R0789 Other chest pain: Secondary | ICD-10-CM

## 2011-08-04 DIAGNOSIS — J069 Acute upper respiratory infection, unspecified: Secondary | ICD-10-CM | POA: Insufficient documentation

## 2011-08-04 DIAGNOSIS — R071 Chest pain on breathing: Secondary | ICD-10-CM

## 2011-08-04 MED ORDER — CEFUROXIME AXETIL 250 MG PO TABS: 250.0000 mg | ORAL_TABLET | Freq: Two times a day (BID) | ORAL | Status: AC

## 2011-08-04 MED ORDER — PREDNISONE 10 MG PO TABS: ORAL_TABLET | ORAL | Status: DC

## 2011-08-04 NOTE — Assessment & Plan Note (Signed)
Start Abx Prednisone 10 mg: take 4 tabs a day x 3 days; then 3 tabs a day x 4 days; then 2 tabs a day x 4 days, then 1 tab a day x 6 days, then stop. Take pc.

## 2011-08-04 NOTE — Progress Notes (Signed)
  Subjective:    Patient ID: Toni King, female    DOB: 07/15/1927, 78 y.o.   MRN: [id number]  HPI   HPI  C/o URI sx's x   days. C/o ST, CP through back --cough, weakness. She had a CXR and a CT of the chest in [place] a wk ago. Not better with abx, OTC medicines. Actually, the patient is getting worse. The patient did not sleep last night due to cough.  Review of Systems  Constitutional: Positive for fever, chills and fatigue.  HENT: Positive for congestion, rhinorrhea, sneezing and postnasal drip.   Eyes: Positive for photophobia and pain. Negative for discharge and visual disturbance.  Respiratory: Positive for cough and wheezing.   Positive for chest pain.  Gastrointestinal: Negative for vomiting, abdominal pain, diarrhea and abdominal distention.  Genitourinary: Negative for dysuria and difficulty urinating.  Skin: Negative for rash.  Neurological: Positive for dizziness, weakness and light-headedness.      Review of Systems     Objective:   Physical Exam  Constitutional: She appears well-developed. No distress.  HENT:  Head: Normocephalic.  Right Ear: External ear normal.  Left Ear: External ear normal.  Nose: Nose normal.  Mouth/Throat: Oropharynx is clear and moist.       eryth throat  Eyes: Conjunctivae are normal. Pupils are equal, round, and reactive to light. Right eye exhibits no discharge. Left eye exhibits no discharge.  Neck: Normal range of motion. Neck supple. No JVD present. No tracheal deviation present. No thyromegaly present.  Cardiovascular: Normal rate, regular rhythm and normal heart sounds.   No murmur heard. Pulmonary/Chest: Effort normal and breath sounds normal. No stridor. No respiratory distress. She has no wheezes. She has no rales. She exhibits tenderness (anterior chest is tender B costochondr junctions and parasternally).  Abdominal: Soft. Bowel sounds are normal. She exhibits no distension and no mass. There is no tenderness. There is no  rebound and no guarding.  Musculoskeletal: She exhibits no edema and no tenderness.  Lymphadenopathy:    She has no cervical adenopathy.  Neurological: She displays normal reflexes. No cranial nerve deficit. She exhibits normal muscle tone. Coordination normal.  Skin: No rash noted. No erythema.  Psychiatric: She has a normal mood and affect. Her behavior is normal. Judgment and thought content normal.          Assessment & Plan:

## 2011-08-04 NOTE — Assessment & Plan Note (Signed)
Continue with current prescription therapy as reflected on the Med list.  

## 2011-08-04 NOTE — Assessment & Plan Note (Addendum)
3/13 costochondritis She declined EKG Start ABX Prednisone 10 mg: take 4 tabs a day x 3 days; then 3 tabs a day x 4 days; then 2 tabs a day x 4 days, then 1 tab a day x 6 days, then stop. Take pc.  Potential benefits of a short term steroid  use as well as potential risks  and complications were explained to the patient and were aknowledged.

## 2011-08-05 IMAGING — CR DG LUMBAR SPINE 2-3V
3 series · 3 of 3 positions shown · non-contrast
Comparison: None

CLINICAL DATA: Low back pain.

LUMBAR SPINE - 2-3 VIEW

[view not recorded (1 of 3)]
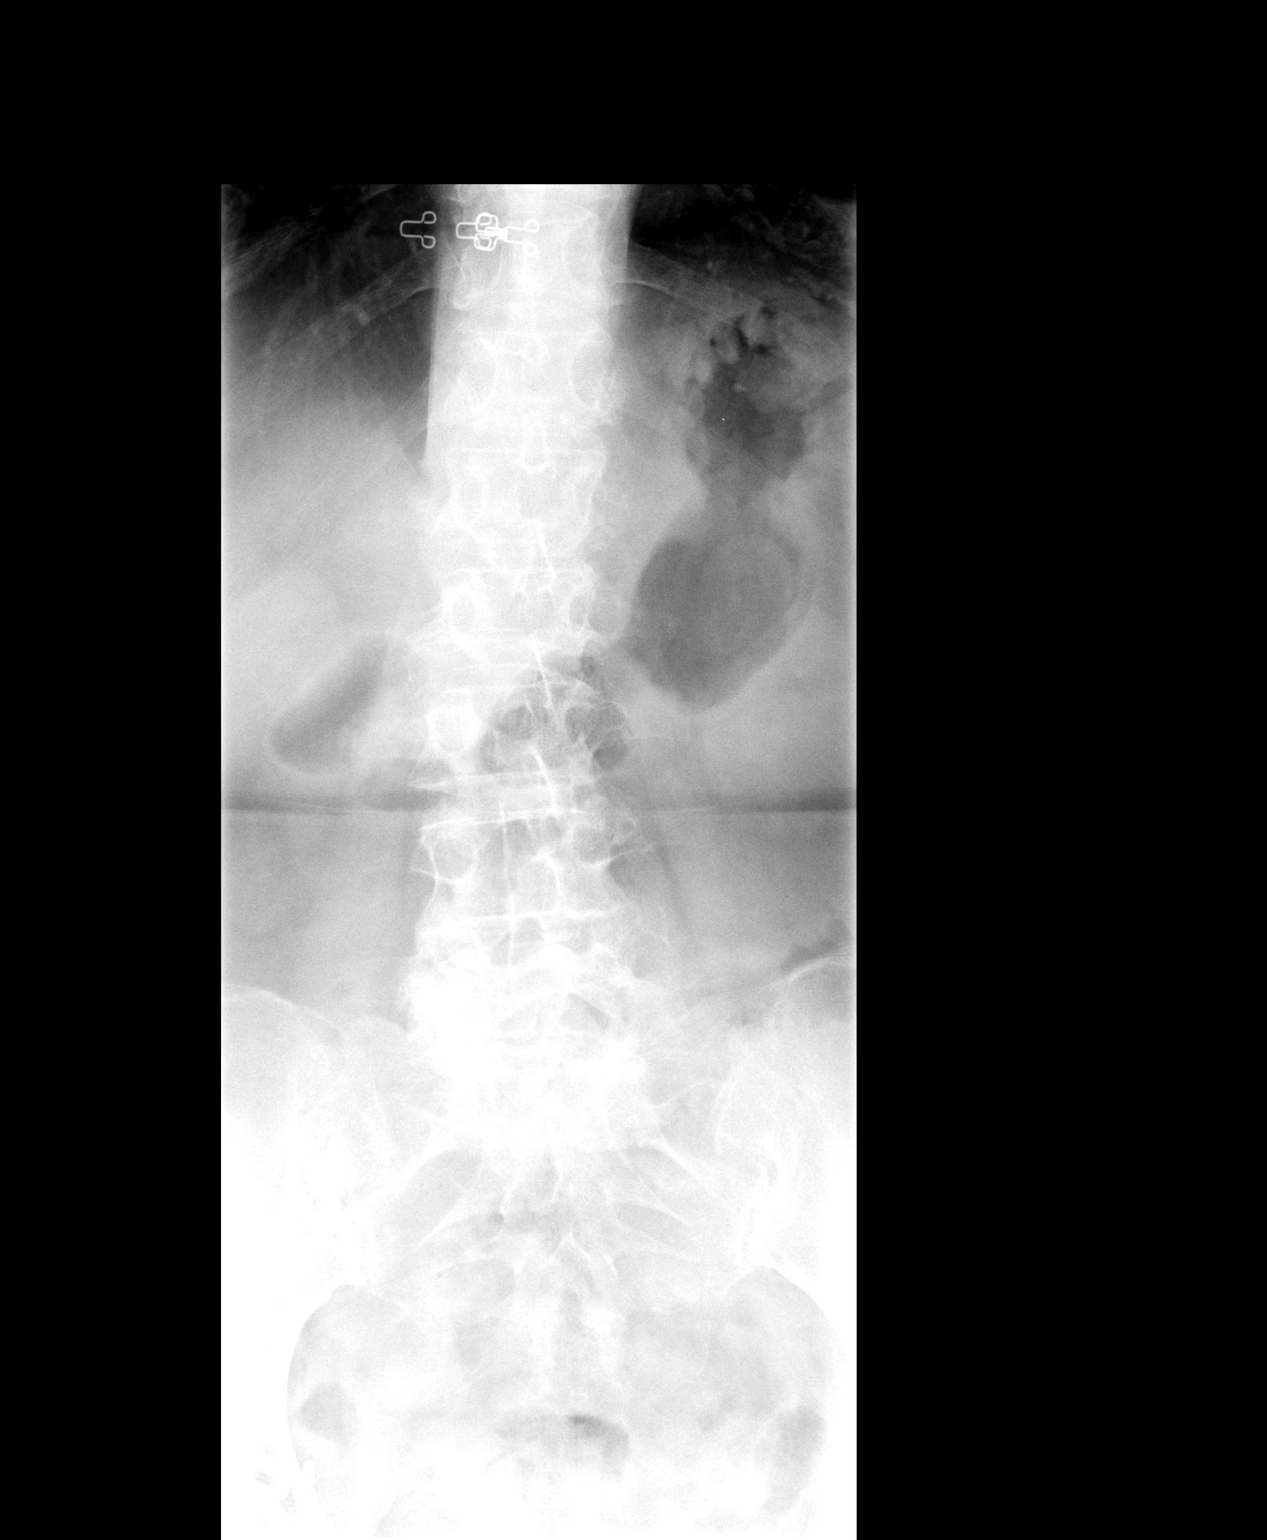

[view not recorded (2 of 3)]
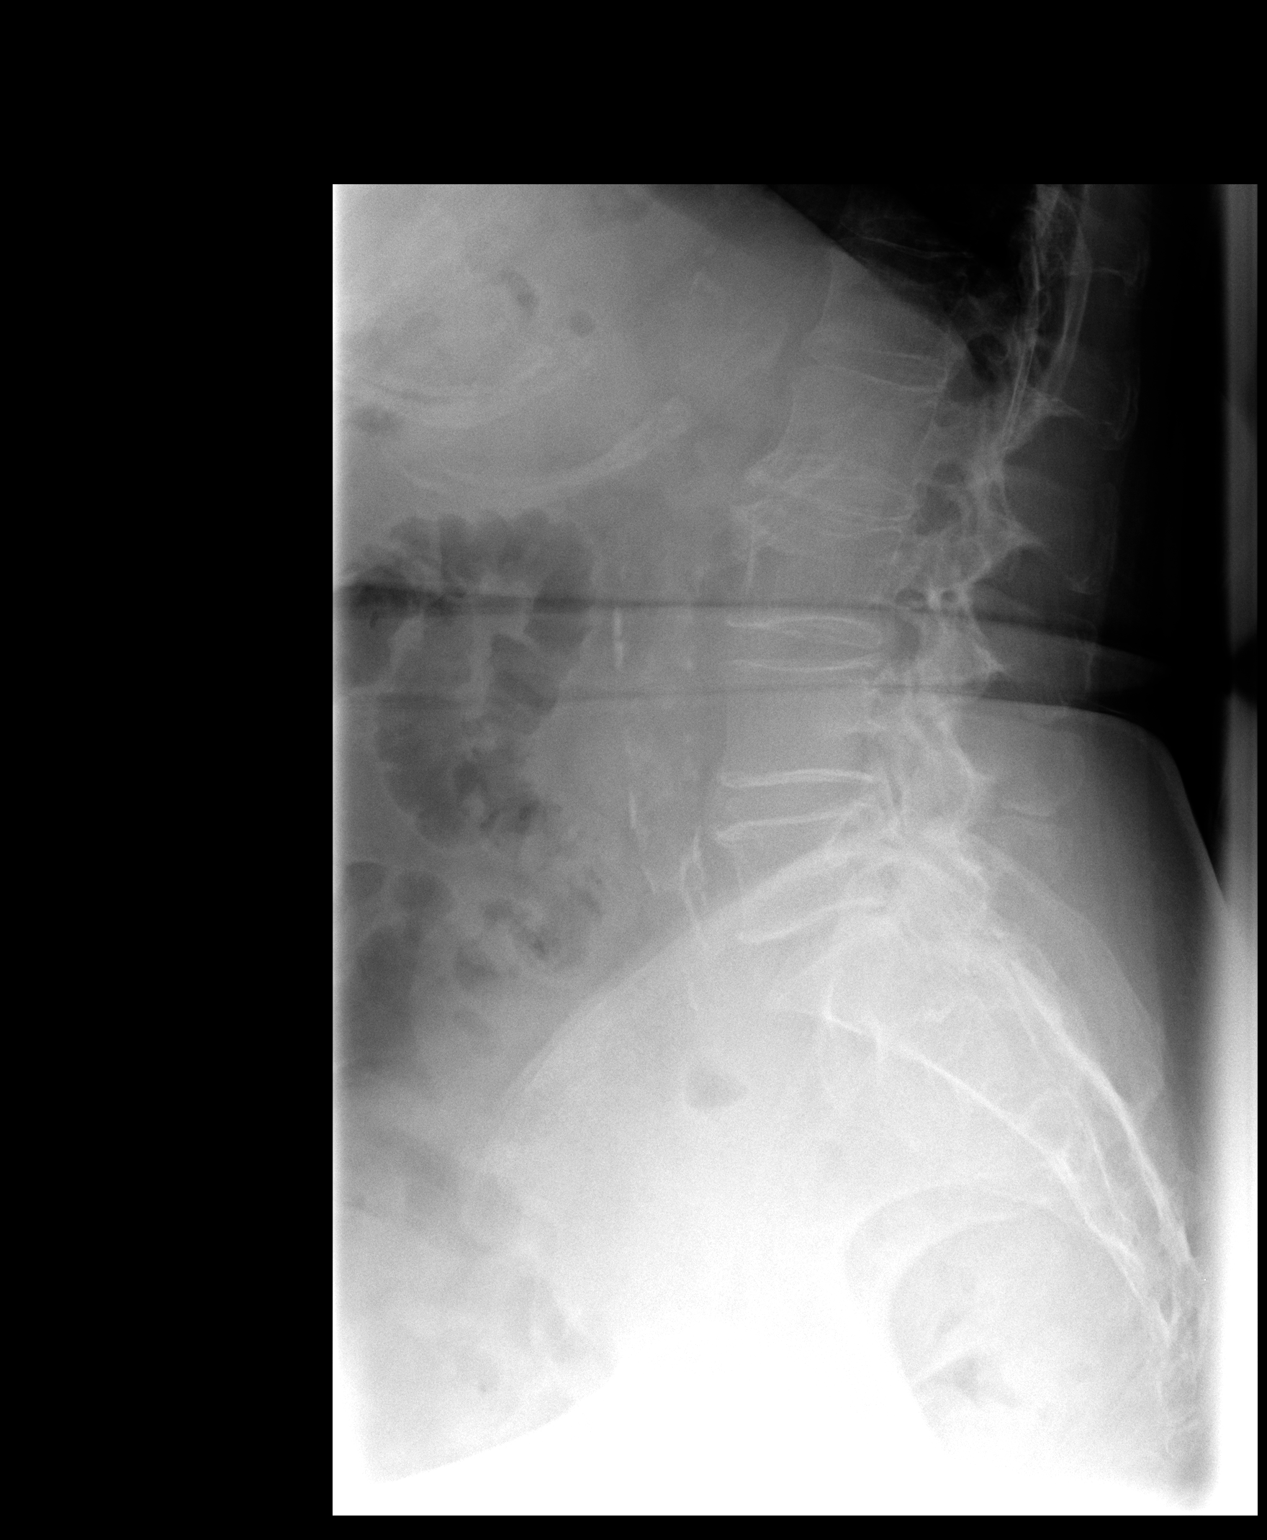

[view not recorded (3 of 3)]
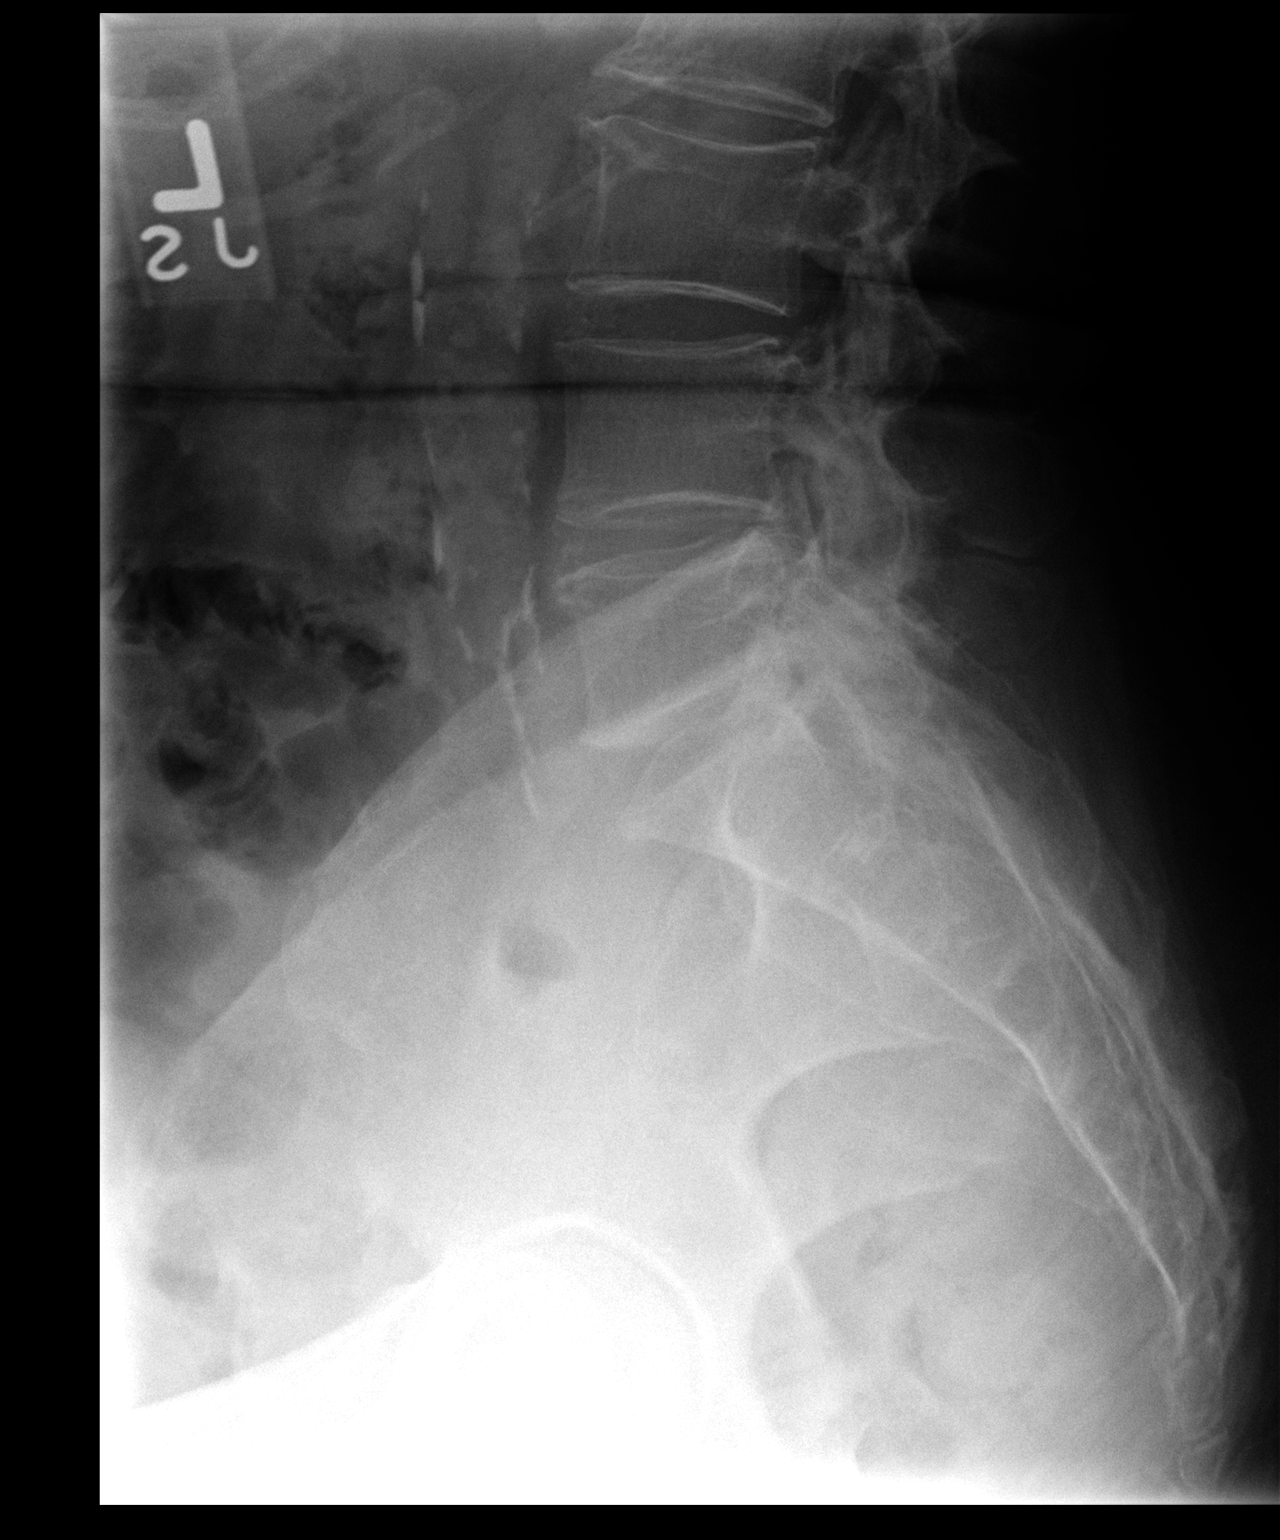

[3 of 3 positions shown; findings below may reference images not displayed]

FINDINGS: Diffuse bony demineralization.  Osteophytic formation at
L2-3, mainly on the right.  Very minimal dextroscoliosis.  Probable
degenerative changes involving the right L4-5 and L5-S1 facet
joints.  No pars defects or spondylolisthesis.
IMPRESSION: Spondylotic changes as described above.  Minimal dextroscoliosis.

## 2011-09-01 ENCOUNTER — Other Ambulatory Visit: Payer: Self-pay | Admitting: Internal Medicine

## 2011-09-04 ENCOUNTER — Other Ambulatory Visit: Payer: Self-pay | Admitting: Internal Medicine

## 2011-10-15 ENCOUNTER — Ambulatory Visit (INDEPENDENT_AMBULATORY_CARE_PROVIDER_SITE_OTHER): Payer: BC Managed Care – PPO | Admitting: Internal Medicine

## 2011-10-15 ENCOUNTER — Encounter: Payer: Self-pay | Admitting: Internal Medicine

## 2011-10-15 ENCOUNTER — Other Ambulatory Visit (INDEPENDENT_AMBULATORY_CARE_PROVIDER_SITE_OTHER): Payer: BC Managed Care – PPO

## 2011-10-15 VITALS — BP 128/70 | HR 68 | Temp 97.3°F | Resp 16 | Wt 130.0 lb

## 2011-10-15 DIAGNOSIS — R42 Dizziness and giddiness: Secondary | ICD-10-CM

## 2011-10-15 DIAGNOSIS — R0789 Other chest pain: Secondary | ICD-10-CM

## 2011-10-15 DIAGNOSIS — R209 Unspecified disturbances of skin sensation: Secondary | ICD-10-CM

## 2011-10-15 DIAGNOSIS — R071 Chest pain on breathing: Secondary | ICD-10-CM

## 2011-10-15 DIAGNOSIS — I10 Essential (primary) hypertension: Secondary | ICD-10-CM

## 2011-10-15 DIAGNOSIS — R202 Paresthesia of skin: Secondary | ICD-10-CM

## 2011-10-15 DIAGNOSIS — R7309 Other abnormal glucose: Secondary | ICD-10-CM

## 2011-10-15 DIAGNOSIS — R739 Hyperglycemia, unspecified: Secondary | ICD-10-CM

## 2011-10-15 DIAGNOSIS — G4700 Insomnia, unspecified: Secondary | ICD-10-CM

## 2011-10-15 DIAGNOSIS — E785 Hyperlipidemia, unspecified: Secondary | ICD-10-CM

## 2011-10-15 DIAGNOSIS — R609 Edema, unspecified: Secondary | ICD-10-CM

## 2011-10-15 LAB — BASIC METABOLIC PANEL
Chloride: 103 mEq/L (ref 96–112)
Potassium: 4.4 mEq/L (ref 3.5–5.1)
Sodium: 140 mEq/L (ref 135–145)

## 2011-10-15 LAB — CBC WITH DIFFERENTIAL/PLATELET
Basophils Absolute: 0 10*3/uL (ref 0.0–0.1)
Eosinophils Absolute: 0.1 10*3/uL (ref 0.0–0.7)
Lymphocytes Relative: 32.8 % (ref 12.0–46.0)
MCHC: 32.9 g/dL (ref 30.0–36.0)
Neutrophils Relative %: 58.2 % (ref 43.0–77.0)
Platelets: 184 10*3/uL (ref 150.0–400.0)
RBC: 4.24 Mil/uL (ref 3.87–5.11)
RDW: 13.1 % (ref 11.5–14.6)

## 2011-10-15 LAB — URINALYSIS
Ketones, ur: NEGATIVE
Leukocytes, UA: NEGATIVE
Specific Gravity, Urine: 1.01 (ref 1.000–1.030)
Urobilinogen, UA: 0.2 (ref 0.0–1.0)

## 2011-10-15 LAB — HEPATIC FUNCTION PANEL
ALT: 14 U/L (ref 0–35)
Bilirubin, Direct: 0.1 mg/dL (ref 0.0–0.3)
Total Protein: 6.7 g/dL (ref 6.0–8.3)

## 2011-10-15 LAB — VITAMIN B12: Vitamin B-12: 533 pg/mL (ref 211–911)

## 2011-10-15 MED ORDER — LORAZEPAM 0.5 MG PO TABS: 0.5000 mg | ORAL_TABLET | Freq: Every evening | ORAL | Status: AC | PRN

## 2011-10-15 NOTE — Patient Instructions (Signed)
Take Benicar 20 one a day in palce of Enalapril

## 2011-10-15 NOTE — Assessment & Plan Note (Signed)
Resolved

## 2011-10-15 NOTE — Assessment & Plan Note (Addendum)
6/13 ? etiol Try Benicar in place of Vasotec Hold Furosemide x 2-3 d Labs  Lorazepam at hs, rest more

## 2011-10-15 NOTE — Assessment & Plan Note (Signed)
Refractory  Potential benefits of a long term benzodiazepines  use as well as potential risks  and complications were explained to the patient and were aknowledged.

## 2011-10-15 NOTE — Assessment & Plan Note (Signed)
Continue with current prescription therapy as reflected on the Med list.  

## 2011-10-15 NOTE — Assessment & Plan Note (Signed)
See changes in Meds

## 2011-10-15 NOTE — Progress Notes (Signed)
Patient ID: Toni King, female   DOB: 10/16/1927, 78 y.o.   MRN: [id number]  Subjective:    Patient ID: Toni King, female    DOB: 1928/02/05, 78 y.o.   MRN: [id number]  Headache      C/o lightheadedness and HA attimes Working 40+ h per wk and sleeping very little No LOC, CP, SOB    Review of Systems  Neurological: Positive for headaches.       Objective:   Physical Exam  Constitutional: She appears well-developed. No distress.  HENT:  Head: Normocephalic.  Right Ear: External ear normal.  Left Ear: External ear normal.  Nose: Nose normal.  Mouth/Throat: Oropharynx is clear and moist.       eryth throat  Eyes: Conjunctivae are normal. Pupils are equal, round, and reactive to light. Right eye exhibits no discharge. Left eye exhibits no discharge.  Neck: Normal range of motion. Neck supple. No JVD present. No tracheal deviation present. No thyromegaly present.  Cardiovascular: Normal rate, regular rhythm and normal heart sounds.   No murmur heard. Pulmonary/Chest: Effort normal and breath sounds normal. No stridor. No respiratory distress. She has no wheezes. She has no rales. She exhibits tenderness (anterior chest is tender B costochondr junctions and parasternally).  Abdominal: Soft. Bowel sounds are normal. She exhibits no distension and no mass. There is no tenderness. There is no rebound and no guarding.  Musculoskeletal: She exhibits no edema and no tenderness.  Lymphadenopathy:    She has no cervical adenopathy.  Neurological: She displays normal reflexes. No cranial nerve deficit. She exhibits normal muscle tone. Coordination normal.  Skin: No rash noted. No erythema.  Psychiatric: She has a normal mood and affect. Her behavior is normal. Judgment and thought content normal.          Assessment & Plan:

## 2011-11-10 ENCOUNTER — Other Ambulatory Visit: Payer: Self-pay

## 2011-11-10 MED ORDER — OLMESARTAN MEDOXOMIL 20 MG PO TABS: 20.0000 mg | ORAL_TABLET | Freq: Every day | ORAL | Status: DC

## 2011-11-14 ENCOUNTER — Telehealth: Payer: Self-pay | Admitting: Internal Medicine

## 2011-11-14 NOTE — Telephone Encounter (Signed)
Pls stay on Benicar Thx

## 2011-11-14 NOTE — Telephone Encounter (Signed)
Pt's spouse informed of MD's advisement to resume Benicar and to callback office with any questions/concerns.

## 2011-11-14 NOTE — Telephone Encounter (Signed)
Caller: Toni King/Patient; PCP: Plotnikov, Alex; CB#: [phone]; ; ; Call regarding Needs Clarification;  Patient is calling about had missed the call from office, they had spoke to her husband and she just wants to clarify that Dr. Huntley Dec was aware that patient has not been taking any blood pressure medication since 11/09/11.  Advised patient that message was given to Dr. Huntley Dec who advised to continue on Benicar and to check blood pressure prior to taking medication and to take medication at the same time daily.  Patient verbalized understanding.

## 2011-11-14 NOTE — Telephone Encounter (Signed)
Caller: Angeleena/Patient; PCP: Plotnikov, Alex; CB#: [phone]; Calling to see if she needs to be seen in office. Given Message from Dr. Posey Rea that he would like her to go back on Benicar. She expressed concerns over lightheadedness and low pressure at times. She is drinking plenty of fluids- no other sx. Advised to check pressure before taking and to take at same time daily and to call with any concerns.  Medication Questions Protocol.

## 2011-11-14 NOTE — Telephone Encounter (Signed)
Caller: Toni King/Patient; PCP: Plotnikov, Alex; CB#: [phone]; Call regarding Questions About Changes in BP;  Seen 10/15/11; MD stopped Vasotec d/t dizziness; started on Benicar. When picked up RX, RPh explained it needs to be taked approx same time qd and advised to check BP at home before taking Benicar.  Asking what is high and low BP?  Report recent BP ranging from 139-99 / 76-54.  Stopped using Benicar 11/09/11.  Asking if should remain off Benicar?  Felt lightheaded again 11/13/11 with BP 139-99/7054. Mild headache present with mild lightheadedness at work. Advised to see MD within 8 hrs for sudden drop in blood pressure and recent change in RX medications or their dosage per Hypertension Diagnosed or Suspected Guideline. No appts remain for 11/14/11. Info noted and sent to [facility] pool for call back.

## 2011-11-17 ENCOUNTER — Other Ambulatory Visit: Payer: Self-pay | Admitting: Internal Medicine

## 2011-12-02 ENCOUNTER — Ambulatory Visit (INDEPENDENT_AMBULATORY_CARE_PROVIDER_SITE_OTHER): Payer: BC Managed Care – PPO | Admitting: Internal Medicine

## 2011-12-02 ENCOUNTER — Encounter: Payer: Self-pay | Admitting: Internal Medicine

## 2011-12-02 VITALS — BP 120/78 | HR 80 | Temp 97.4°F | Resp 16 | Wt 126.0 lb

## 2011-12-02 DIAGNOSIS — R9431 Abnormal electrocardiogram [ECG] [EKG]: Secondary | ICD-10-CM

## 2011-12-02 DIAGNOSIS — R42 Dizziness and giddiness: Secondary | ICD-10-CM

## 2011-12-02 DIAGNOSIS — G4700 Insomnia, unspecified: Secondary | ICD-10-CM

## 2011-12-02 DIAGNOSIS — I10 Essential (primary) hypertension: Secondary | ICD-10-CM

## 2011-12-02 DIAGNOSIS — R011 Cardiac murmur, unspecified: Secondary | ICD-10-CM

## 2011-12-02 DIAGNOSIS — I739 Peripheral vascular disease, unspecified: Secondary | ICD-10-CM

## 2011-12-02 DIAGNOSIS — R5381 Other malaise: Secondary | ICD-10-CM

## 2011-12-02 DIAGNOSIS — R5383 Other fatigue: Secondary | ICD-10-CM

## 2011-12-02 NOTE — Assessment & Plan Note (Signed)
Carot doppler to repeat

## 2011-12-02 NOTE — Assessment & Plan Note (Addendum)
Continue with current prescription therapy as reflected on the Med list. Repeat carotid doppler US

## 2011-12-02 NOTE — Assessment & Plan Note (Signed)
Card ECHO

## 2011-12-02 NOTE — Assessment & Plan Note (Signed)
Ongoing - she hasn't tried the med yet Try Rx

## 2011-12-02 NOTE — Assessment & Plan Note (Signed)
Continue with current prescription therapy as reflected on the Med list.  

## 2011-12-02 NOTE — Assessment & Plan Note (Signed)
Chronic - poss insomnia related

## 2011-12-02 NOTE — Progress Notes (Signed)
   Subjective:    Patient ID: Toni King, female    DOB: January 10, 1928, 78 y.o.   MRN: [id number]  Headache  Pertinent negatives include no coughing or dizziness.   F/u HTN  C/o not feeling well lightheadedness and HA at times; tired, blurred vision Working 40+ h per wk and sleeping very little No LOC, CP, SOB  Wt Readings from Last 3 Encounters:  12/02/11 126 lb (57.153 kg)  10/15/11 130 lb (58.968 kg)  08/04/11 135 lb (61.236 kg)   BP Readings from Last 3 Encounters:  12/02/11 120/78  10/15/11 128/70  08/04/11 128/70        Review of Systems  Constitutional: Positive for fatigue.  Respiratory: Negative for cough, choking and wheezing.   Cardiovascular: Negative for chest pain and leg swelling.  Neurological: Positive for headaches. Negative for dizziness.  Psychiatric/Behavioral: Negative for disturbed wake/sleep cycle. The patient is not nervous/anxious.        Objective:   Physical Exam  Constitutional: She appears well-developed. No distress.  HENT:  Head: Normocephalic.  Right Ear: External ear normal.  Left Ear: External ear normal.  Nose: Nose normal.  Mouth/Throat: Oropharynx is clear and moist.       eryth throat  Eyes: Conjunctivae are normal. Pupils are equal, round, and reactive to light. Right eye exhibits no discharge. Left eye exhibits no discharge.  Neck: Normal range of motion. Neck supple. No JVD present. No tracheal deviation present. No thyromegaly present.  Cardiovascular: Normal rate and regular rhythm.   Murmur (mild) heard. Pulmonary/Chest: Effort normal and breath sounds normal. No stridor. No respiratory distress. She has no wheezes. She has no rales. She exhibits tenderness (anterior chest is tender B costochondr junctions and parasternally).  Abdominal: Soft. Bowel sounds are normal. She exhibits no distension and no mass. There is no tenderness. There is no rebound and no guarding.  Musculoskeletal: She exhibits no edema and no  tenderness.  Lymphadenopathy:    She has no cervical adenopathy.  Neurological: She displays normal reflexes. No cranial nerve deficit. She exhibits normal muscle tone. Coordination normal.  Skin: No rash noted. No erythema.  Psychiatric: She has a normal mood and affect. Her behavior is normal. Judgment and thought content normal.   Lab Results  Component Value Date   WBC 7.2 10/15/2011   HGB 13.4 10/15/2011   HCT 40.7 10/15/2011   PLT 184.0 10/15/2011   GLUCOSE 90 10/15/2011   CHOL 233* 02/05/2011   TRIG 134.0 02/05/2011   HDL 60.00 02/05/2011   LDLDIRECT 160.0 02/05/2011   ALT 14 10/15/2011   AST 17 10/15/2011   NA 140 10/15/2011   K 4.4 10/15/2011   CL 103 10/15/2011   CREATININE 0.7 10/15/2011   BUN 20 10/15/2011   CO2 30 10/15/2011   TSH 1.99 10/15/2011   HGBA1C 5.8 10/25/2008          Assessment & Plan:

## 2011-12-04 ENCOUNTER — Ambulatory Visit (HOSPITAL_COMMUNITY): Payer: BC Managed Care – PPO | Attending: Cardiology | Admitting: Radiology

## 2011-12-04 DIAGNOSIS — E785 Hyperlipidemia, unspecified: Secondary | ICD-10-CM | POA: Insufficient documentation

## 2011-12-04 DIAGNOSIS — R011 Cardiac murmur, unspecified: Secondary | ICD-10-CM | POA: Insufficient documentation

## 2011-12-04 DIAGNOSIS — Z87891 Personal history of nicotine dependence: Secondary | ICD-10-CM | POA: Insufficient documentation

## 2011-12-04 DIAGNOSIS — R609 Edema, unspecified: Secondary | ICD-10-CM | POA: Insufficient documentation

## 2011-12-04 DIAGNOSIS — R9431 Abnormal electrocardiogram [ECG] [EKG]: Secondary | ICD-10-CM | POA: Insufficient documentation

## 2011-12-04 DIAGNOSIS — R079 Chest pain, unspecified: Secondary | ICD-10-CM | POA: Insufficient documentation

## 2011-12-04 DIAGNOSIS — I739 Peripheral vascular disease, unspecified: Secondary | ICD-10-CM | POA: Insufficient documentation

## 2011-12-04 DIAGNOSIS — R5381 Other malaise: Secondary | ICD-10-CM | POA: Insufficient documentation

## 2011-12-04 DIAGNOSIS — I10 Essential (primary) hypertension: Secondary | ICD-10-CM | POA: Insufficient documentation

## 2011-12-04 NOTE — Progress Notes (Signed)
Echocardiogram performed.  

## 2011-12-05 ENCOUNTER — Telehealth: Payer: Self-pay | Admitting: Internal Medicine

## 2011-12-05 ENCOUNTER — Other Ambulatory Visit: Payer: Self-pay | Admitting: Cardiology

## 2011-12-05 DIAGNOSIS — I6529 Occlusion and stenosis of unspecified carotid artery: Secondary | ICD-10-CM

## 2011-12-05 NOTE — Telephone Encounter (Signed)
Toni King, please, inform patient that her heart ECHO was ok Thx

## 2011-12-05 NOTE — Telephone Encounter (Signed)
Left detailed mess informing pt of below.  

## 2011-12-09 ENCOUNTER — Other Ambulatory Visit: Payer: Self-pay | Admitting: Internal Medicine

## 2011-12-09 ENCOUNTER — Ambulatory Visit (INDEPENDENT_AMBULATORY_CARE_PROVIDER_SITE_OTHER): Payer: BC Managed Care – PPO

## 2011-12-09 DIAGNOSIS — I6529 Occlusion and stenosis of unspecified carotid artery: Secondary | ICD-10-CM

## 2011-12-12 ENCOUNTER — Telehealth: Payer: Self-pay | Admitting: Internal Medicine

## 2011-12-12 NOTE — Telephone Encounter (Signed)
Pt informed

## 2011-12-12 NOTE — Telephone Encounter (Signed)
Toni King, please, inform patient that her carotid US was ok (w/mild blockage only) Thx

## 2012-05-19 ENCOUNTER — Other Ambulatory Visit: Payer: Self-pay | Admitting: *Deleted

## 2012-05-19 MED ORDER — OLMESARTAN MEDOXOMIL 20 MG PO TABS: 20.0000 mg | ORAL_TABLET | Freq: Every day | ORAL | Status: DC

## 2012-06-03 ENCOUNTER — Encounter: Payer: Self-pay | Admitting: Internal Medicine

## 2012-06-03 ENCOUNTER — Ambulatory Visit (INDEPENDENT_AMBULATORY_CARE_PROVIDER_SITE_OTHER): Payer: BC Managed Care – PPO | Admitting: Internal Medicine

## 2012-06-03 ENCOUNTER — Other Ambulatory Visit (INDEPENDENT_AMBULATORY_CARE_PROVIDER_SITE_OTHER): Payer: BC Managed Care – PPO

## 2012-06-03 VITALS — BP 138/70 | HR 72 | Temp 96.9°F | Resp 16 | Wt 132.0 lb

## 2012-06-03 DIAGNOSIS — R202 Paresthesia of skin: Secondary | ICD-10-CM

## 2012-06-03 DIAGNOSIS — E785 Hyperlipidemia, unspecified: Secondary | ICD-10-CM

## 2012-06-03 DIAGNOSIS — R209 Unspecified disturbances of skin sensation: Secondary | ICD-10-CM

## 2012-06-03 DIAGNOSIS — I10 Essential (primary) hypertension: Secondary | ICD-10-CM

## 2012-06-03 LAB — LIPID PANEL
Cholesterol: 263 mg/dL — ABNORMAL HIGH (ref 0–200)
HDL: 79.1 mg/dL (ref >39.00)
Total CHOL/HDL Ratio: 3
Triglycerides: 67 mg/dL (ref 0.0–149.0)
VLDL: 13.4 mg/dL (ref 0.0–40.0)

## 2012-06-03 LAB — BASIC METABOLIC PANEL
CO2: 29 mEq/L (ref 19–32)
Chloride: 103 mEq/L (ref 96–112)
Potassium: 4.6 mEq/L (ref 3.5–5.1)
Sodium: 140 mEq/L (ref 135–145)

## 2012-06-03 LAB — VITAMIN B12: Vitamin B-12: 916 pg/mL — ABNORMAL HIGH (ref 211–911)

## 2012-06-03 LAB — TSH: TSH: 1.93 u[IU]/mL (ref 0.35–5.50)

## 2012-06-03 LAB — LDL CHOLESTEROL, DIRECT: Direct LDL: 156.9 mg/dL

## 2012-06-03 LAB — HEPATIC FUNCTION PANEL
AST: 19 U/L (ref 0–37)
Albumin: 4.9 g/dL (ref 3.5–5.2)
Total Bilirubin: 0.6 mg/dL (ref 0.3–1.2)

## 2012-06-03 MED ORDER — HYDROCORTISONE ACETATE 25 MG RE SUPP: 25.0000 mg | Freq: Two times a day (BID) | RECTAL | Status: DC

## 2012-06-03 MED ORDER — OLMESARTAN MEDOXOMIL 20 MG PO TABS: 20.0000 mg | ORAL_TABLET | Freq: Every day | ORAL | Status: DC

## 2012-06-03 NOTE — Progress Notes (Signed)
   Subjective:    Headache  Pertinent negatives include no coughing or dizziness.   C/o fatigue, neck pain and HAs F/u HTN  C/o not feeling well lightheadedness and HA at times; tired, blurred vision Working 40+ h per wk and sleeping very little No LOC, CP, SOB  Wt Readings from Last 3 Encounters:  06/03/12 132 lb (59.875 kg)  12/02/11 126 lb (57.153 kg)  10/15/11 130 lb (58.968 kg)   BP Readings from Last 3 Encounters:  06/03/12 138/70  12/02/11 120/78  10/15/11 128/70        Review of Systems  Constitutional: Positive for fatigue.  Respiratory: Negative for cough, choking and wheezing.   Cardiovascular: Negative for chest pain and leg swelling.  Neurological: Positive for headaches. Negative for dizziness.  Psychiatric/Behavioral: Negative for sleep disturbance. The patient is not nervous/anxious.        Objective:   Physical Exam  Constitutional: She appears well-developed. No distress.  HENT:  Head: Normocephalic.  Right Ear: External ear normal.  Left Ear: External ear normal.  Nose: Nose normal.  Mouth/Throat: Oropharynx is clear and moist.       eryth throat  Eyes: Conjunctivae normal are normal. Pupils are equal, round, and reactive to light. Right eye exhibits no discharge. Left eye exhibits no discharge.  Neck: Normal range of motion. Neck supple. No JVD present. No tracheal deviation present. No thyromegaly present.  Cardiovascular: Normal rate and regular rhythm.   Murmur (mild) heard. Pulmonary/Chest: Effort normal and breath sounds normal. No stridor. No respiratory distress. She has no wheezes. She has no rales.  Abdominal: Soft. Bowel sounds are normal. She exhibits no distension and no mass. There is no tenderness. There is no rebound and no guarding.  Musculoskeletal: She exhibits no edema and no tenderness.       Neck and traps are tender to palp  Lymphadenopathy:    She has no cervical adenopathy.  Neurological: She displays normal  reflexes. No cranial nerve deficit. She exhibits normal muscle tone. Coordination normal.  Skin: No rash noted. No erythema.  Psychiatric: She has a normal mood and affect. Her behavior is normal. Judgment and thought content normal.   Lab Results  Component Value Date   WBC 7.2 10/15/2011   HGB 13.4 10/15/2011   HCT 40.7 10/15/2011   PLT 184.0 10/15/2011   GLUCOSE 90 10/15/2011   CHOL 233* 02/05/2011   TRIG 134.0 02/05/2011   HDL 60.00 02/05/2011   LDLDIRECT 160.0 02/05/2011   ALT 14 10/15/2011   AST 17 10/15/2011   NA 140 10/15/2011   K 4.4 10/15/2011   CL 103 10/15/2011   CREATININE 0.7 10/15/2011   BUN 20 10/15/2011   CO2 30 10/15/2011   TSH 1.99 10/15/2011   HGBA1C 5.8 10/25/2008          Assessment & Plan:

## 2012-06-03 NOTE — Patient Instructions (Signed)
You need an ergonomic work station

## 2012-07-08 ENCOUNTER — Other Ambulatory Visit: Payer: Self-pay | Admitting: Internal Medicine

## 2012-09-01 ENCOUNTER — Ambulatory Visit (INDEPENDENT_AMBULATORY_CARE_PROVIDER_SITE_OTHER): Payer: BC Managed Care – PPO | Admitting: Internal Medicine

## 2012-09-01 ENCOUNTER — Encounter: Payer: Self-pay | Admitting: Internal Medicine

## 2012-09-01 VITALS — BP 110/70 | HR 72 | Temp 97.4°F | Resp 16 | Wt 136.0 lb

## 2012-09-01 DIAGNOSIS — I10 Essential (primary) hypertension: Secondary | ICD-10-CM

## 2012-09-01 DIAGNOSIS — Z0000 Encounter for general adult medical examination without abnormal findings: Secondary | ICD-10-CM

## 2012-09-01 DIAGNOSIS — E785 Hyperlipidemia, unspecified: Secondary | ICD-10-CM

## 2012-09-01 MED ORDER — HYDROCORTISONE ACETATE 25 MG RE SUPP: 25.0000 mg | Freq: Two times a day (BID) | RECTAL | Status: DC

## 2012-09-01 MED ORDER — FUROSEMIDE 20 MG PO TABS: 20.0000 mg | ORAL_TABLET | Freq: Every day | ORAL | Status: DC

## 2012-09-01 NOTE — Progress Notes (Signed)
   Subjective:    Headache  The problem occurs intermittently. The pain is mild. Pertinent negatives include no coughing or dizziness.   C/o fatigue, neck pain and HAs F/u HTN  C/o not feeling well lightheadedness and HA at times; tired, blurred vision Working 40+ h per wk and sleeping very little No LOC, CP, SOB  Wt Readings from Last 3 Encounters:  09/01/12 136 lb (61.689 kg)  06/03/12 132 lb (59.875 kg)  12/02/11 126 lb (57.153 kg)   BP Readings from Last 3 Encounters:  09/01/12 110/70  06/03/12 138/70  12/02/11 120/78        Review of Systems  Constitutional: Positive for fatigue.  Respiratory: Negative for cough, choking and wheezing.   Cardiovascular: Negative for chest pain and leg swelling.  Neurological: Positive for headaches. Negative for dizziness.  Psychiatric/Behavioral: Negative for sleep disturbance. The patient is not nervous/anxious.        Objective:   Physical Exam  Constitutional: She appears well-developed. No distress.  HENT:  Head: Normocephalic.  Right Ear: External ear normal.  Left Ear: External ear normal.  Nose: Nose normal.  Mouth/Throat: Oropharynx is clear and moist.  eryth throat  Eyes: Conjunctivae are normal. Pupils are equal, round, and reactive to light. Right eye exhibits no discharge. Left eye exhibits no discharge.  Neck: Normal range of motion. Neck supple. No JVD present. No tracheal deviation present. No thyromegaly present.  Cardiovascular: Normal rate and regular rhythm.   Murmur (mild) heard. Pulmonary/Chest: Effort normal and breath sounds normal. No stridor. No respiratory distress. She has no wheezes. She has no rales.  Abdominal: Soft. Bowel sounds are normal. She exhibits no distension and no mass. There is no tenderness. There is no rebound and no guarding.  Musculoskeletal: She exhibits no edema and no tenderness.  Neck and traps are tender to palp  Lymphadenopathy:    She has no cervical adenopathy.   Neurological: She displays normal reflexes. No cranial nerve deficit. She exhibits normal muscle tone. Coordination normal.  Skin: No rash noted. No erythema.  Psychiatric: She has a normal mood and affect. Her behavior is normal. Judgment and thought content normal.   Lab Results  Component Value Date   WBC 7.2 10/15/2011   HGB 13.4 10/15/2011   HCT 40.7 10/15/2011   PLT 184.0 10/15/2011   GLUCOSE 114* 06/03/2012   CHOL 263* 06/03/2012   TRIG 67.0 06/03/2012   HDL 79.10 06/03/2012   LDLDIRECT 156.9 06/03/2012   ALT 17 06/03/2012   AST 19 06/03/2012   NA 140 06/03/2012   K 4.6 06/03/2012   CL 103 06/03/2012   CREATININE 0.8 06/03/2012   BUN 24* 06/03/2012   CO2 29 06/03/2012   TSH 1.93 06/03/2012   HGBA1C 5.8 10/25/2008          Assessment & Plan:

## 2012-09-01 NOTE — Assessment & Plan Note (Signed)
Taking Benicar 1/2 tab a day now

## 2012-11-15 ENCOUNTER — Other Ambulatory Visit: Payer: Self-pay | Admitting: Internal Medicine

## 2013-01-24 ENCOUNTER — Other Ambulatory Visit: Payer: Self-pay | Admitting: Internal Medicine

## 2013-02-24 ENCOUNTER — Other Ambulatory Visit (INDEPENDENT_AMBULATORY_CARE_PROVIDER_SITE_OTHER): Payer: BC Managed Care – PPO

## 2013-02-24 DIAGNOSIS — E785 Hyperlipidemia, unspecified: Secondary | ICD-10-CM

## 2013-02-24 DIAGNOSIS — I10 Essential (primary) hypertension: Secondary | ICD-10-CM

## 2013-02-24 DIAGNOSIS — Z0000 Encounter for general adult medical examination without abnormal findings: Secondary | ICD-10-CM

## 2013-02-24 LAB — URINALYSIS
Bilirubin Urine: NEGATIVE
Ketones, ur: NEGATIVE
Leukocytes, UA: NEGATIVE
Specific Gravity, Urine: 1.015 (ref 1.000–1.030)
Total Protein, Urine: NEGATIVE
pH: 6 (ref 5.0–8.0)

## 2013-02-24 LAB — BASIC METABOLIC PANEL
CO2: 27 mEq/L (ref 19–32)
Chloride: 104 mEq/L (ref 96–112)
Creatinine, Ser: 0.8 mg/dL (ref 0.4–1.2)
Potassium: 4.6 mEq/L (ref 3.5–5.1)

## 2013-02-24 LAB — CBC WITH DIFFERENTIAL/PLATELET
Basophils Relative: 0.5 % (ref 0.0–3.0)
Eosinophils Absolute: 0.1 10*3/uL (ref 0.0–0.7)
Eosinophils Relative: 1.7 % (ref 0.0–5.0)
HCT: 39.5 % (ref 36.0–46.0)
Lymphs Abs: 1.4 10*3/uL (ref 0.7–4.0)
MCHC: 34.3 g/dL (ref 30.0–36.0)
MCV: 95.1 fl (ref 78.0–100.0)
Monocytes Absolute: 0.5 10*3/uL (ref 0.1–1.0)
Monocytes Relative: 8 % (ref 3.0–12.0)
Neutro Abs: 3.9 10*3/uL (ref 1.4–7.7)
Neutrophils Relative %: 66 % (ref 43.0–77.0)
RBC: 4.15 Mil/uL (ref 3.87–5.11)
RDW: 11.8 % (ref 11.5–14.6)
WBC: 6 10*3/uL (ref 4.5–10.5)

## 2013-02-24 LAB — HEPATIC FUNCTION PANEL
ALT: 14 U/L (ref 0–35)
AST: 15 U/L (ref 0–37)
Albumin: 4.2 g/dL (ref 3.5–5.2)
Total Bilirubin: 0.6 mg/dL (ref 0.3–1.2)
Total Protein: 6.7 g/dL (ref 6.0–8.3)

## 2013-02-24 LAB — LIPID PANEL
Cholesterol: 231 mg/dL — ABNORMAL HIGH (ref 0–200)
Total CHOL/HDL Ratio: 3
Triglycerides: 105 mg/dL (ref 0.0–149.0)

## 2013-02-24 LAB — LDL CHOLESTEROL, DIRECT: Direct LDL: 145.5 mg/dL

## 2013-02-25 ENCOUNTER — Other Ambulatory Visit: Payer: Self-pay | Admitting: Internal Medicine

## 2013-03-03 ENCOUNTER — Ambulatory Visit (INDEPENDENT_AMBULATORY_CARE_PROVIDER_SITE_OTHER): Payer: BC Managed Care – PPO | Admitting: Internal Medicine

## 2013-03-03 ENCOUNTER — Encounter: Payer: Self-pay | Admitting: Internal Medicine

## 2013-03-03 VITALS — BP 170/90 | HR 76 | Temp 97.5°F | Resp 16 | Wt 140.0 lb

## 2013-03-03 DIAGNOSIS — N952 Postmenopausal atrophic vaginitis: Secondary | ICD-10-CM

## 2013-03-03 DIAGNOSIS — Z23 Encounter for immunization: Secondary | ICD-10-CM

## 2013-03-03 DIAGNOSIS — R350 Frequency of micturition: Secondary | ICD-10-CM

## 2013-03-03 DIAGNOSIS — I10 Essential (primary) hypertension: Secondary | ICD-10-CM

## 2013-03-03 MED ORDER — FUROSEMIDE 20 MG PO TABS: 20.0000 mg | ORAL_TABLET | Freq: Every day | ORAL | Status: AC

## 2013-03-03 MED ORDER — ESTROGENS, CONJUGATED 0.625 MG/GM VA CREA: 1.0000 g | TOPICAL_CREAM | VAGINAL | Status: AC

## 2013-03-03 MED ORDER — OLMESARTAN MEDOXOMIL 20 MG PO TABS: 20.0000 mg | ORAL_TABLET | Freq: Every day | ORAL | Status: DC

## 2013-03-03 MED ORDER — TRIAMCINOLONE ACETONIDE 0.5 % EX OINT: TOPICAL_OINTMENT | Freq: Two times a day (BID) | CUTANEOUS | Status: DC

## 2013-03-03 NOTE — Progress Notes (Signed)
   Subjective:    HPI C/o vaginal itching x 2 wks C/o suprapubic abd pain F/u fatigue, neck pain and HAs F/u HTN  C/o not feeling well lightheadedness and HA at times; tired, blurred vision Working 40+ h per wk and sleeping very little No LOC, CP, SOB  Wt Readings from Last 3 Encounters:  03/03/13 140 lb (63.504 kg)  09/01/12 136 lb (61.689 kg)  06/03/12 132 lb (59.875 kg)   BP Readings from Last 3 Encounters:  03/03/13 170/90  09/01/12 110/70  06/03/12 138/70        Review of Systems  Constitutional: Positive for fatigue.  Respiratory: Negative for choking and wheezing.   Cardiovascular: Negative for chest pain and leg swelling.  Psychiatric/Behavioral: Negative for sleep disturbance. The patient is not nervous/anxious.        Objective:   Physical Exam  Constitutional: She appears well-developed. No distress.  HENT:  Head: Normocephalic.  Right Ear: External ear normal.  Left Ear: External ear normal.  Nose: Nose normal.  Mouth/Throat: Oropharynx is clear and moist.  eryth throat  Eyes: Conjunctivae are normal. Pupils are equal, round, and reactive to light. Right eye exhibits no discharge. Left eye exhibits no discharge.  Neck: Normal range of motion. Neck supple. No JVD present. No tracheal deviation present. No thyromegaly present.  Cardiovascular: Normal rate and regular rhythm.   Murmur (mild) heard. Pulmonary/Chest: Effort normal and breath sounds normal. No stridor. No respiratory distress. She has no wheezes. She has no rales.  Abdominal: Soft. Bowel sounds are normal. She exhibits no distension and no mass. There is no tenderness. There is no rebound and no guarding.  Musculoskeletal: She exhibits no edema and no tenderness.  Neck and traps are tender to palp  Lymphadenopathy:    She has no cervical adenopathy.  Neurological: She displays normal reflexes. No cranial nerve deficit. She exhibits normal muscle tone. Coordination normal.  Skin: No rash  noted. No erythema.  Psychiatric: She has a normal mood and affect. Her behavior is normal. Judgment and thought content normal.   Lab Results  Component Value Date   WBC 6.0 02/24/2013   HGB 13.6 02/24/2013   HCT 39.5 02/24/2013   PLT 156.0 02/24/2013   GLUCOSE 120* 02/24/2013   CHOL 231* 02/24/2013   TRIG 105.0 02/24/2013   HDL 74.40 02/24/2013   LDLDIRECT 145.5 02/24/2013   ALT 14 02/24/2013   AST 15 02/24/2013   NA 140 02/24/2013   K 4.6 02/24/2013   CL 104 02/24/2013   CREATININE 0.8 02/24/2013   BUN 15 02/24/2013   CO2 27 02/24/2013   TSH 3.19 02/24/2013   HGBA1C 5.8 10/25/2008          Assessment & Plan:

## 2013-03-03 NOTE — Assessment & Plan Note (Signed)
Continue with current prescription therapy as reflected on the Med list.  

## 2013-03-03 NOTE — Assessment & Plan Note (Signed)
UA was nl

## 2013-03-03 NOTE — Assessment & Plan Note (Signed)
Triamc oint short term Premarin cream pv

## 2013-04-11 ENCOUNTER — Other Ambulatory Visit: Payer: Self-pay | Admitting: Internal Medicine

## 2013-04-25 ENCOUNTER — Encounter: Payer: Self-pay | Admitting: Gastroenterology

## 2013-04-25 ENCOUNTER — Encounter: Payer: Self-pay | Admitting: Internal Medicine

## 2013-04-25 ENCOUNTER — Ambulatory Visit (INDEPENDENT_AMBULATORY_CARE_PROVIDER_SITE_OTHER): Payer: BC Managed Care – PPO | Admitting: Internal Medicine

## 2013-04-25 VITALS — BP 142/70 | HR 76 | Temp 96.9°F | Resp 16 | Wt 135.0 lb

## 2013-04-25 DIAGNOSIS — M545 Low back pain, unspecified: Secondary | ICD-10-CM

## 2013-04-25 DIAGNOSIS — R109 Unspecified abdominal pain: Secondary | ICD-10-CM

## 2013-04-25 DIAGNOSIS — N952 Postmenopausal atrophic vaginitis: Secondary | ICD-10-CM

## 2013-04-25 DIAGNOSIS — I10 Essential (primary) hypertension: Secondary | ICD-10-CM

## 2013-04-25 DIAGNOSIS — Z23 Encounter for immunization: Secondary | ICD-10-CM

## 2013-04-25 NOTE — Progress Notes (Signed)
Pre visit review using our clinic review tool, if applicable. No additional management support is needed unless otherwise documented below in the visit note. 

## 2013-04-25 NOTE — Assessment & Plan Note (Signed)
2014 suprapubic ?etiology GI consult Milk free trial (no milk, ice cream, cheese and yogurt) for 4-6 weeks. OK to use almond, coconut, rice or soy milk. "Almond breeze" brand tastes good.

## 2013-04-25 NOTE — Progress Notes (Signed)
   Subjective:    Abdominal Pain This is a recurrent problem. The current episode started more than 1 month ago. The onset quality is undetermined. The problem occurs intermittently. The problem has been waxing and waning (?better). The pain is located in the suprapubic region. The pain is at a severity of 5/10. The pain is moderate. The quality of the pain is cramping. The abdominal pain does not radiate. She has tried nothing for the symptoms.   F/u vaginal itching - better  F/u suprapubic abd pain. She drinks milk at night. She takes Miralax in am. F/u fatigue, neck pain and HAs F/u HTN  C/o not feeling well lightheadedness and HA at times; tired, blurred vision Working 40+ h per wk and sleeping very little No LOC, CP, SOB  Wt Readings from Last 3 Encounters:  04/25/13 135 lb (61.236 kg)  03/03/13 140 lb (63.504 kg)  09/01/12 136 lb (61.689 kg)   BP Readings from Last 3 Encounters:  04/25/13 142/70  03/03/13 170/90  09/01/12 110/70     Review of Systems  Constitutional: Positive for fatigue.  Respiratory: Negative for choking and wheezing.   Cardiovascular: Negative for chest pain and leg swelling.  Gastrointestinal: Positive for abdominal pain.  Psychiatric/Behavioral: Negative for sleep disturbance. The patient is not nervous/anxious.        Objective:   Physical Exam  Constitutional: She appears well-developed. No distress.  HENT:  Head: Normocephalic.  Right Ear: External ear normal.  Left Ear: External ear normal.  Nose: Nose normal.  Mouth/Throat: Oropharynx is clear and moist.  eryth throat  Eyes: Conjunctivae are normal. Pupils are equal, round, and reactive to light. Right eye exhibits no discharge. Left eye exhibits no discharge.  Neck: Normal range of motion. Neck supple. No JVD present. No tracheal deviation present. No thyromegaly present.  Cardiovascular: Normal rate and regular rhythm.   Murmur (mild) heard. Pulmonary/Chest: Effort normal and  breath sounds normal. No stridor. No respiratory distress. She has no wheezes. She has no rales.  Abdominal: Soft. Bowel sounds are normal. She exhibits no distension and no mass. There is no tenderness. There is no rebound and no guarding.  Musculoskeletal: She exhibits no edema and no tenderness.  Neck and traps are tender to palp  Lymphadenopathy:    She has no cervical adenopathy.  Neurological: She displays normal reflexes. No cranial nerve deficit. She exhibits normal muscle tone. Coordination normal.  Skin: No rash noted. No erythema.  Psychiatric: She has a normal mood and affect. Her behavior is normal. Judgment and thought content normal.   Lab Results  Component Value Date   WBC 6.0 02/24/2013   HGB 13.6 02/24/2013   HCT 39.5 02/24/2013   PLT 156.0 02/24/2013   GLUCOSE 120* 02/24/2013   CHOL 231* 02/24/2013   TRIG 105.0 02/24/2013   HDL 74.40 02/24/2013   LDLDIRECT 145.5 02/24/2013   ALT 14 02/24/2013   AST 15 02/24/2013   NA 140 02/24/2013   K 4.6 02/24/2013   CL 104 02/24/2013   CREATININE 0.8 02/24/2013   BUN 15 02/24/2013   CO2 27 02/24/2013   TSH 3.19 02/24/2013   HGBA1C 5.8 10/25/2008          Assessment & Plan:

## 2013-04-25 NOTE — Patient Instructions (Addendum)
  Milk free trial (no milk, ice cream, cheese and yogurt) for 4-6 weeks. OK to use almond, coconut, rice or soy milk. "Almond breeze" brand tastes good.  Try Align x 2 weeks

## 2013-04-25 NOTE — Assessment & Plan Note (Signed)
Better  

## 2013-04-25 NOTE — Assessment & Plan Note (Signed)
Continue with current prn therapy as reflected on the Med list.  

## 2013-04-25 NOTE — Assessment & Plan Note (Signed)
Continue with current prescription therapy as reflected on the Med list.  

## 2013-05-24 ENCOUNTER — Encounter: Payer: Self-pay | Admitting: Internal Medicine

## 2013-05-24 ENCOUNTER — Ambulatory Visit (INDEPENDENT_AMBULATORY_CARE_PROVIDER_SITE_OTHER): Payer: BC Managed Care – PPO | Admitting: Internal Medicine

## 2013-05-24 ENCOUNTER — Telehealth: Payer: Self-pay | Admitting: Gastroenterology

## 2013-05-24 VITALS — BP 146/78 | HR 84 | Ht 60.5 in | Wt 135.0 lb

## 2013-05-24 DIAGNOSIS — R198 Other specified symptoms and signs involving the digestive system and abdomen: Secondary | ICD-10-CM

## 2013-05-24 DIAGNOSIS — K625 Hemorrhage of anus and rectum: Secondary | ICD-10-CM

## 2013-05-24 DIAGNOSIS — R194 Change in bowel habit: Secondary | ICD-10-CM

## 2013-05-24 DIAGNOSIS — R109 Unspecified abdominal pain: Secondary | ICD-10-CM

## 2013-05-24 MED ORDER — MOVIPREP 100 G PO SOLR: 1.0000 | Freq: Once | ORAL | Status: DC

## 2013-05-24 NOTE — Telephone Encounter (Signed)
Stated insurance will not pay for moviprep unless MD [place]. SHE WAS INSTRUCTED TO CALL OFFICE in am to either obtain another prep or provide contact number to insurance company

## 2013-05-24 NOTE — Patient Instructions (Addendum)

## 2013-05-24 NOTE — Progress Notes (Signed)
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 She presents today regarding persistent lower abdominal discomfort and change in bowel habits. Patient reports to me a 3 month history of new lower abdominal pain or discomfort that keeps her awake at night. She has had some nausea but no vomiting. There has been bloating. She also describes a change in bowel habits. Stools are now pencil thin and darker. She does notice occasional red blood with wiping after defecation. She attributes this to hemorrhoids. There has been some increased bowel urgency. She does have chronic constipation for which she uses MiraLax regularly. She generally has a bowel movement each day. She denies the defecation or urination affect symptoms. Since the onset of symptoms, they have not worsened or improved. Her weight has been stable. She saw Dr. Lyla Son remotely for "polyps". Review of the records shows that her last colonoscopy was performed in October of 2003. Her colon was said to be tortuous. The examination was otherwise remarkable for external hemorrhoids only. She has tried to eliminate dairy products, though this has not helped. She was evaluated by her PCP. Review of blood work from October 2014 was unremarkable including comprehensive metabolic panel and CBC with differential as well as TSH. She denies new medications. She is status post total abdominal hysterectomy remotely  REVIEW OF SYSTEMS:  All non-GI ROS negative except for arthritis  Past Medical History  Diagnosis Date  . GERD (gastroesophageal reflux disease)   . Hyperlipidemia     statin intolerant  . Osteopenia   . Hemorrhoids   . Hypertension   . Constipation   . PVD (peripheral vascular disease)     carotids  . Osteoarthritis     Past Surgical History  Procedure Laterality Date  . Abdominal hysterectomy      complete    Social  History FUJIKO PICAZO  reports that she has quit smoking. She does not have any smokeless tobacco history on file. She reports that she does not drink alcohol or use illicit drugs.  family history includes Arthritis in her mother; Diabetes in her brother; Early death (age of onset: 25) in her father; Heart disease in her father.  Allergies  Allergen Reactions  . Alendronate Sodium   . Atorvastatin   . Carbamazepine   . Ezetimibe-Simvastatin     REACTION: aches  . Fish Oil     REACTION: sick       PHYSICAL EXAMINATION: Vital signs: BP 146/78  Pulse 84  Ht 5' 0.5" (1.537 m)  Wt 135 lb (61.236 kg)  BMI 25.92 kg/m2  Constitutional: generally well-appearing, no acute distress Psychiatric: alert and oriented x3, cooperative Eyes: extraocular movements intact, anicteric, conjunctiva pink Mouth: oral pharynx moist, no lesions Neck: supple no lymphadenopathy Cardiovascular: heart regular rate and rhythm, no murmur Lungs: clear to auscultation bilaterally Abdomen: soft, nontender, nondistended, no obvious ascites, no peritoneal signs, normal bowel sounds, no organomegaly Rectal: Deferred until colonoscopy Extremities: no lower extremity edema bilaterally Skin: no lesions on visible extremities Neuro: No focal deficits.   ASSESSMENT:  #65. 80 year old female with a 3 month history of persistent lower abdominal discomfort associated with change in bowel habits and possible bleeding. Last colonoscopy 2003 as noted. Rule out intracolonic lesion such as neoplasia. #2. Chronic constipation. Bowels managed with MiraLax   PLAN:  #1. Colonoscopy.The nature of the procedure, as well as the risks, benefits, and alternatives  were carefully and thoroughly reviewed with the patient. Ample time for discussion and questions allowed. The patient understood, was satisfied, and agreed to proceed. #2. Movi prep prescribed. The patient instructed on its use #3. Colonoscopy unrevealing, then CT scan of  the abdomen and pelvis to be arranged. Discussed with patient

## 2013-05-25 ENCOUNTER — Telehealth: Payer: Self-pay | Admitting: Internal Medicine

## 2013-05-25 NOTE — Telephone Encounter (Signed)
Called patient and let her know I left a free voucher for her Moviprep up front.  Patient agreed to come pick it up

## 2013-05-25 NOTE — Telephone Encounter (Signed)
See if you can get this lady Movi prep sample for her procedure. Thanks

## 2013-05-25 NOTE — Telephone Encounter (Signed)
Attempted to call pt at work and at home to let her know I have a voucher she can use for a free Moviprep. Left message for pt to call back.  Pt got voucher for Moviprep.

## 2013-05-26 ENCOUNTER — Other Ambulatory Visit: Payer: Self-pay

## 2013-05-26 ENCOUNTER — Telehealth: Payer: Self-pay

## 2013-05-26 ENCOUNTER — Encounter: Payer: Self-pay | Admitting: Internal Medicine

## 2013-05-26 ENCOUNTER — Ambulatory Visit (INDEPENDENT_AMBULATORY_CARE_PROVIDER_SITE_OTHER): Payer: BC Managed Care – PPO

## 2013-05-26 ENCOUNTER — Ambulatory Visit (AMBULATORY_SURGERY_CENTER): Payer: BC Managed Care – PPO | Admitting: Internal Medicine

## 2013-05-26 VITALS — BP 138/65 | HR 61 | Temp 97.0°F | Resp 0 | Ht 60.0 in | Wt 135.0 lb

## 2013-05-26 DIAGNOSIS — R109 Unspecified abdominal pain: Secondary | ICD-10-CM

## 2013-05-26 DIAGNOSIS — T887XXA Unspecified adverse effect of drug or medicament, initial encounter: Secondary | ICD-10-CM

## 2013-05-26 DIAGNOSIS — D126 Benign neoplasm of colon, unspecified: Secondary | ICD-10-CM

## 2013-05-26 DIAGNOSIS — E785 Hyperlipidemia, unspecified: Secondary | ICD-10-CM

## 2013-05-26 DIAGNOSIS — R198 Other specified symptoms and signs involving the digestive system and abdomen: Secondary | ICD-10-CM

## 2013-05-26 LAB — CBC WITH DIFFERENTIAL/PLATELET
BASOS PCT: 1 % (ref 0.0–3.0)
Basophils Absolute: 0.1 10*3/uL (ref 0.0–0.1)
EOS PCT: 1.9 % (ref 0.0–5.0)
Eosinophils Absolute: 0.1 10*3/uL (ref 0.0–0.7)
HCT: 40.5 % (ref 36.0–46.0)
HEMOGLOBIN: 13.8 g/dL (ref 12.0–15.0)
LYMPHS PCT: 25.1 % (ref 12.0–46.0)
Lymphs Abs: 1.3 10*3/uL (ref 0.7–4.0)
MCHC: 34.1 g/dL (ref 30.0–36.0)
MCV: 94.9 fl (ref 78.0–100.0)
MONOS PCT: 7.4 % (ref 3.0–12.0)
Monocytes Absolute: 0.4 10*3/uL (ref 0.1–1.0)
NEUTROS PCT: 64.6 % (ref 43.0–77.0)
Neutro Abs: 3.5 10*3/uL (ref 1.4–7.7)
Platelets: 152 10*3/uL (ref 150.0–400.0)
RBC: 4.27 Mil/uL (ref 3.87–5.11)
RDW: 12.3 % (ref 11.5–14.6)
WBC: 5.4 10*3/uL (ref 4.5–10.5)

## 2013-05-26 LAB — LIPID PANEL
CHOL/HDL RATIO: 3
Cholesterol: 225 mg/dL — ABNORMAL HIGH (ref 0–200)
HDL: 70 mg/dL (ref >39.00)
TRIGLYCERIDES: 83 mg/dL (ref 0.0–149.0)
VLDL: 16.6 mg/dL (ref 0.0–40.0)

## 2013-05-26 LAB — LDL CHOLESTEROL, DIRECT: Direct LDL: 147.6 mg/dL

## 2013-05-26 MED ORDER — SODIUM CHLORIDE 0.9 % IV SOLN: 500.0000 mL | INTRAVENOUS | Status: DC

## 2013-05-26 NOTE — Progress Notes (Signed)
Called to room to assist during endoscopic procedure.  Patient ID and intended procedure confirmed with present staff. Received instructions for my participation in the procedure from the performing physician.  

## 2013-05-26 NOTE — Progress Notes (Signed)
A/ox3 pleased with MAC, report to Annette RN 

## 2013-05-26 NOTE — Progress Notes (Signed)
No complaints noted in the recovery room. Maw   

## 2013-05-26 NOTE — Progress Notes (Signed)
Pt to the lab for blood work on discharge taken by Pamala Hurry. Maw

## 2013-05-26 NOTE — Patient Instructions (Signed)
YOU HAD AN ENDOSCOPIC PROCEDURE TODAY AT THE [facility]: Refer to the procedure report that was given to you for any specific questions about what was found during the examination.  If the procedure report does not answer your questions, please call your gastroenterologist to clarify.  If you requested that your care partner not be given the details of your procedure findings, then the procedure report has been included in a sealed envelope for you to review at your convenience later.  YOU SHOULD EXPECT: Some feelings of bloating in the abdomen. Passage of more gas than usual.  Walking can help get rid of the air that was put into your GI tract during the procedure and reduce the bloating. If you had a lower endoscopy (such as a colonoscopy or flexible sigmoidoscopy) you may notice spotting of blood in your stool or on the toilet paper. If you underwent a bowel prep for your procedure, then you may not have a normal bowel movement for a few days.  DIET: Your first meal following the procedure should be a light meal and then it is ok to progress to your normal diet.  A half-sandwich or bowl of soup is an example of a good first meal.  Heavy or fried foods are harder to digest and may make you feel nauseous or bloated.  Likewise meals heavy in dairy and vegetables can cause extra gas to form and this can also increase the bloating.  Drink plenty of fluids but you should avoid alcoholic beverages for 24 hours.  ACTIVITY: Your care partner should take you home directly after the procedure.  You should plan to take it easy, moving slowly for the rest of the day.  You can resume normal activity the day after the procedure however you should NOT DRIVE or use heavy machinery for 24 hours (because of the sedation medicines used during the test).    SYMPTOMS TO REPORT IMMEDIATELY: A gastroenterologist can be reached at any hour.  During normal business hours, 8:30 AM to 5:00 PM Monday through Friday,  call [phone].  After hours and on weekends, please call the GI answering service at [phone] who will take a message and have the physician on call contact you.   Following lower endoscopy (colonoscopy or flexible sigmoidoscopy):  Excessive amounts of blood in the stool  Significant tenderness or worsening of abdominal pains  Swelling of the abdomen that is new, acute  Fever of 100F or higher FOLLOW UP: If any biopsies were taken you will be contacted by phone or by letter within the next 1-3 weeks.  Call your gastroenterologist if you have not heard about the biopsies in 3 weeks.  Our staff will call the home number listed on your records the next business day following your procedure to check on you and address any questions or concerns that you may have at that time regarding the information given to you following your procedure. This is a courtesy call and so if there is no answer at the home number and we have not heard from you through the emergency physician on call, we will assume that you have returned to your regular daily activities without incident.  SIGNATURES/CONFIDENTIALITY: You and/or your care partner have signed paperwork which will be entered into your electronic medical record.  These signatures attest to the fact that that the information above on your After Visit Summary has been reviewed and is understood.  Full responsibility of the confidentiality of this discharge   information lies with you and/or your care-partner.   Handouts was given to your care partner on polyps, diverticulosis and a high fiber diet.abdomen and pelvis for lower abdominal pain.  Two bottles of contrast was given to your care partner along with instructions to be filled out about CT. The 3rd floor nurse will call you with an appointment for a CT.  You may resume your current medications today. Please call if any questions or concerns.

## 2013-05-26 NOTE — Telephone Encounter (Signed)
Pt scheduled for CT of A/P at [facility] CT 05/31/13@9am . Pt to be NPO after 5am except for bottle 1 of contrast at 7am and bottle 2 at 8am. Pt instructed of appt and instructions by [facility] nurse.

## 2013-05-26 NOTE — Op Note (Signed)
[place]  [address] [place], [id number]   COLONOSCOPY PROCEDURE REPORT  PATIENT: Toni, King  MR#: [id number] BIRTHDATE: 06-13-1927 , 85  yrs. old GENDER: Female ENDOSCOPIST: Eustace Quail, MD REFERRED BY:.  Self / Office PROCEDURE DATE:  05/26/2013 PROCEDURE:   Colonoscopy with snare polypectomy x 1 First Screening Colonoscopy - Avg.  risk and is 50 yrs.  old or older - No.  Prior Negative Screening - Now for repeat screening. N/A  History of Adenoma - Now for follow-up colonoscopy & has been > or = to 3 yrs.  N/A  Polyps Removed Today? Yes. ASA CLASS:   Class II INDICATIONS:Abdominal pain and Change in bowel habits.   ?? GI bleeding MEDICATIONS: MAC sedation, administered by CRNA and propofol (Diprivan) 200mg  IV  DESCRIPTION OF PROCEDURE:   After the risks benefits and alternatives of the procedure were thoroughly explained, informed consent was obtained.  A digital rectal exam revealed no abnormalities of the rectum.   The LB [id number]  endoscope was introduced through the anus and advanced to the cecum, which was identified by both the appendix and ileocecal valve. No adverse events experienced.   The quality of the prep was excellent, using MoviPrep  The instrument was then slowly withdrawn as the colon was fully examined.   COLON FINDINGS: A diminutive sessile polyp was found in the transverse colon.  A polypectomy was performed with a cold snare. The resection was complete and the polyp tissue was completely retrieved.   Mild diverticulosis was noted in the sigmoid colon. The colon mucosa was otherwise normal.  Retroflexed views revealed internal hemorrhoids. The time to cecum=9 minutes 14 seconds. Withdrawal time=10 minutes 08 seconds.  The scope was withdrawn and the procedure completed. COMPLICATIONS: There were no complications.  ENDOSCOPIC IMPRESSION: 1.   Diminutive sessile polyp was found in the transverse  colon; polypectomy was performed with a cold snare 2.   Mild diverticulosis was noted in the sigmoid colon 3.   The colon mucosa was otherwise normal  RECOMMENDATIONS: 1. My office will arrange for you to have a Contrast enhanced CT scan of abdomen and pelvis " lower abdominal pain".   eSigned:  Eustace Quail, MD 05/26/2013 8:44 AM   cc: Altamese Statham.  Plotnikov, MD and The Patient

## 2013-05-27 ENCOUNTER — Telehealth: Payer: Self-pay | Admitting: *Deleted

## 2013-05-27 NOTE — Telephone Encounter (Signed)
  Follow up Call-  Call back number 05/26/2013  Post procedure Call Back phone  # [phone] just dial in pt.s extension  Permission to leave phone message Yes     Patient questions:  Do you have a fever, pain , or abdominal swelling? no Pain Score  0 *  Have you tolerated food without any problems? yes  Have you been able to return to your normal activities? yes  Do you have any questions about your discharge instructions: Diet   no Medications  no Follow up visit  no  Do you have questions or concerns about your Care? no  Actions: * If pain score is 4 or above: No action needed, pain <4.

## 2013-05-30 ENCOUNTER — Ambulatory Visit: Payer: BC Managed Care – PPO | Admitting: Gastroenterology

## 2013-05-30 LAB — BUN: BUN: 12 mg/dL (ref 6–23)

## 2013-05-30 LAB — CREATININE, SERUM: Creatinine, Ser: 0.8 mg/dL (ref 0.4–1.2)

## 2013-05-31 ENCOUNTER — Ambulatory Visit (INDEPENDENT_AMBULATORY_CARE_PROVIDER_SITE_OTHER)
Admission: RE | Admit: 2013-05-31 | Discharge: 2013-05-31 | Disposition: A | Discharge status: Home / Self Care | Payer: BC Managed Care – PPO | Source: Ambulatory Visit | Attending: Internal Medicine | Admitting: Internal Medicine

## 2013-05-31 ENCOUNTER — Encounter: Payer: Self-pay | Admitting: Internal Medicine

## 2013-05-31 DIAGNOSIS — R109 Unspecified abdominal pain: Secondary | ICD-10-CM

## 2013-05-31 MED ORDER — IOHEXOL 300 MG/ML  SOLN
100.0000 mL | Freq: Once | INTRAMUSCULAR | Status: AC | PRN
  Administered 2013-05-31: 100 mL via INTRAVENOUS

## 2013-06-06 ENCOUNTER — Other Ambulatory Visit: Payer: Self-pay | Admitting: Internal Medicine

## 2013-07-14 ENCOUNTER — Telehealth: Payer: Self-pay | Admitting: *Deleted

## 2013-07-14 MED ORDER — LOSARTAN POTASSIUM 100 MG PO TABS: 100.0000 mg | ORAL_TABLET | Freq: Every day | ORAL | Status: AC

## 2013-07-14 NOTE — Telephone Encounter (Signed)
Patient phoned stating insurance company will no longer cover her Benicar.   Alternate suggestions:  Losartan-HCTZ       Valspar-HCTZ         Labetolol-HCTZ  Please advise  CB# [phone]

## 2013-07-14 NOTE — Telephone Encounter (Signed)
OK Losartan instead Thx 

## 2013-07-15 NOTE — Telephone Encounter (Signed)
Phoned patient and notified of new prescription being sent to pharmacy on file.

## 2016-04-28 ENCOUNTER — Telehealth: Payer: Self-pay | Admitting: Internal Medicine

## 2016-04-28 NOTE — Telephone Encounter (Signed)
Patient states that she has moved and has transferred to a new provider.  The new provider wanted patient to get with Dr. Alain Marion to find out why Dr. Alain Marion wanted her to have a colonoscopy.

## 2016-04-30 NOTE — Telephone Encounter (Signed)
In 2015 the pt had a persistent lower abdominal discomfort and change in bowel habits and was found a small polyp on colonoscopy. I have not seen the pt since 2015 and did not make any recommendations. Pls fax Dr Blanch Media note and a colon report and a bx report Thx

## 2016-05-01 NOTE — Telephone Encounter (Addendum)
I called pt. Informed her of below.  She is requesting the below OV notes and colon report to: [facility], [place] Fax # [phone]  Notes faxed.

## 2019-04-28 ENCOUNTER — Telehealth: Payer: Self-pay | Admitting: General Practice

## 2019-04-28 NOTE — Telephone Encounter (Signed)
I have spoken with patient.  She is going to follow up with her current MD in regard

## 2019-04-28 NOTE — Telephone Encounter (Signed)
Pt says that she was a pt of Dr. Mamie Nick. Pt says that she had a shingles shot while with provider. Pt would like to make sure that shot will not affect her if she get the covid vaccine?   CB: [phone]
